# Patient Record
Sex: Female | Born: 1960 | Race: White | Hispanic: No | Marital: Married | Smoking: Never smoker
Health system: Southern US, Community
[De-identification: ages and names within clinical notes are randomized; demographics above are authoritative.]

## PROBLEM LIST (undated history)

## (undated) DIAGNOSIS — N6099 Unspecified benign mammary dysplasia of unspecified breast: Secondary | ICD-10-CM

## (undated) DIAGNOSIS — Z808 Family history of malignant neoplasm of other organs or systems: Secondary | ICD-10-CM

## (undated) DIAGNOSIS — R87619 Unspecified abnormal cytological findings in specimens from cervix uteri: Secondary | ICD-10-CM

## (undated) DIAGNOSIS — K219 Gastro-esophageal reflux disease without esophagitis: Secondary | ICD-10-CM

## (undated) DIAGNOSIS — D649 Anemia, unspecified: Secondary | ICD-10-CM

## (undated) DIAGNOSIS — Z803 Family history of malignant neoplasm of breast: Secondary | ICD-10-CM

## (undated) DIAGNOSIS — N83209 Unspecified ovarian cyst, unspecified side: Secondary | ICD-10-CM

## (undated) DIAGNOSIS — M7062 Trochanteric bursitis, left hip: Secondary | ICD-10-CM

## (undated) HISTORY — PX: OVARIAN CYST SURGERY: SHX726

## (undated) HISTORY — DX: Family history of malignant neoplasm of other organs or systems: Z80.8

## (undated) HISTORY — PX: CERVICAL BIOPSY  W/ LOOP ELECTRODE EXCISION: SUR135

## (undated) HISTORY — DX: Unspecified abnormal cytological findings in specimens from cervix uteri: R87.619

## (undated) HISTORY — PX: TONSILLECTOMY: SUR1361

## (undated) HISTORY — DX: Family history of malignant neoplasm of breast: Z80.3

## (undated) HISTORY — DX: Trochanteric bursitis, left hip: M70.62

## (undated) HISTORY — DX: Unspecified ovarian cyst, unspecified side: N83.209

---

## 1991-12-26 DIAGNOSIS — N83209 Unspecified ovarian cyst, unspecified side: Secondary | ICD-10-CM

## 1991-12-26 HISTORY — DX: Unspecified ovarian cyst, unspecified side: N83.209

## 1993-12-25 DIAGNOSIS — R87619 Unspecified abnormal cytological findings in specimens from cervix uteri: Secondary | ICD-10-CM

## 1993-12-25 HISTORY — DX: Unspecified abnormal cytological findings in specimens from cervix uteri: R87.619

## 2007-12-06 ENCOUNTER — Ambulatory Visit: Payer: Self-pay | Admitting: Internal Medicine

## 2009-03-23 ENCOUNTER — Ambulatory Visit: Payer: Self-pay | Admitting: Internal Medicine

## 2010-03-16 ENCOUNTER — Ambulatory Visit: Payer: Self-pay | Admitting: Internal Medicine

## 2011-03-07 LAB — HM PAP SMEAR: HM Pap smear: NORMAL

## 2011-03-29 ENCOUNTER — Ambulatory Visit: Payer: Self-pay | Admitting: Internal Medicine

## 2011-04-10 ENCOUNTER — Ambulatory Visit: Payer: Self-pay | Admitting: Internal Medicine

## 2012-02-23 ENCOUNTER — Encounter: Payer: Self-pay | Admitting: Internal Medicine

## 2012-02-23 ENCOUNTER — Ambulatory Visit (INDEPENDENT_AMBULATORY_CARE_PROVIDER_SITE_OTHER): Payer: No Typology Code available for payment source | Admitting: Internal Medicine

## 2012-02-23 ENCOUNTER — Other Ambulatory Visit (HOSPITAL_COMMUNITY)
Admission: RE | Admit: 2012-02-23 | Discharge: 2012-02-23 | Disposition: A | Discharge status: Home / Self Care | Payer: No Typology Code available for payment source | Source: Ambulatory Visit | Attending: Internal Medicine | Admitting: Internal Medicine

## 2012-02-23 VITALS — BP 122/80 | HR 93 | Temp 98.6°F | Resp 14 | Ht 67.5 in | Wt 168.5 lb

## 2012-02-23 DIAGNOSIS — R5383 Other fatigue: Secondary | ICD-10-CM

## 2012-02-23 DIAGNOSIS — Z1159 Encounter for screening for other viral diseases: Secondary | ICD-10-CM | POA: Insufficient documentation

## 2012-02-23 DIAGNOSIS — R5381 Other malaise: Secondary | ICD-10-CM

## 2012-02-23 DIAGNOSIS — Z01419 Encounter for gynecological examination (general) (routine) without abnormal findings: Secondary | ICD-10-CM | POA: Insufficient documentation

## 2012-02-23 DIAGNOSIS — N760 Acute vaginitis: Secondary | ICD-10-CM | POA: Insufficient documentation

## 2012-02-23 DIAGNOSIS — Z832 Family history of diseases of the blood and blood-forming organs and certain disorders involving the immune mechanism: Secondary | ICD-10-CM

## 2012-02-23 DIAGNOSIS — Z8249 Family history of ischemic heart disease and other diseases of the circulatory system: Secondary | ICD-10-CM | POA: Insufficient documentation

## 2012-02-23 DIAGNOSIS — Z1239 Encounter for other screening for malignant neoplasm of breast: Secondary | ICD-10-CM

## 2012-02-23 DIAGNOSIS — Z124 Encounter for screening for malignant neoplasm of cervix: Secondary | ICD-10-CM

## 2012-02-23 DIAGNOSIS — Z1211 Encounter for screening for malignant neoplasm of colon: Secondary | ICD-10-CM

## 2012-02-23 NOTE — Assessment & Plan Note (Signed)
Checking TSH,  CMET and CBC .  Encouraged regular exercise, low glycemic index diet.

## 2012-02-23 NOTE — Progress Notes (Signed)
Subjective:    Patient ID: Maria [name], female    DOB: 1961-06-27, 51 y.o.   MRN: [id number]  HPI  Maria [name] is a healthy 51 yr old female who is here for her annual exam.  She continues to have menses  every 28 day,  Mostly clots,  lirghter than previously.  Her mammogram is done  PA every March at [facility].  Her last PAP was in 2011, at which time she was referred to [facility]  for cervical polyp which was removed. No new issues.      Review of Systems  Constitutional: Negative for fever, chills, appetite change, fatigue and unexpected weight change.  HENT: Negative for hearing loss, ear pain, nosebleeds, congestion, sore throat, facial swelling, rhinorrhea, sneezing, mouth sores, trouble swallowing, neck pain, neck stiffness, voice change, postnasal drip, sinus pressure, tinnitus and ear discharge.   Eyes: Negative for pain, discharge, redness and visual disturbance.  Respiratory: Negative for cough, chest tightness, shortness of breath, wheezing and stridor.   Cardiovascular: Negative for chest pain, palpitations and leg swelling.  Gastrointestinal: Negative for nausea, vomiting, abdominal pain, diarrhea, constipation, blood in stool, abdominal distention and anal bleeding.  Genitourinary: Negative for dysuria and flank pain.  Musculoskeletal: Negative for myalgias, arthralgias and gait problem.  Skin: Negative for color change and rash.  Neurological: Negative for dizziness, weakness, light-headedness and headaches.  Hematological: Negative for adenopathy. Does not bruise/bleed easily.  Psychiatric/Behavioral: Negative for suicidal ideas, sleep disturbance and dysphoric mood. The patient is not nervous/anxious.        Objective:   Physical Exam  Constitutional: She is oriented to person, place, and time. She appears well-developed and well-nourished.  HENT:  Mouth/Throat: Oropharynx is clear and moist.  Eyes: EOM are normal. Pupils are equal, round, and reactive to light.  No scleral icterus.  Neck: Normal range of motion. Neck supple. No JVD present. No thyromegaly present.  Cardiovascular: Normal rate, regular rhythm, normal heart sounds and intact distal pulses.   Pulmonary/Chest: Effort normal and breath sounds normal.  Abdominal: Soft. Bowel sounds are normal. She exhibits no mass. There is no tenderness.  Genitourinary: Vagina normal and uterus normal.  Musculoskeletal: Normal range of motion. She exhibits no edema.  Lymphadenopathy:    She has no cervical adenopathy.  Neurological: She is alert and oriented to person, place, and time.  Skin: Skin is warm and dry.  Psychiatric: She has a normal mood and affect.      Assessment & Plan:   Subjective:     Maria [name] is a 51 y.o. female and is here for a comprehensive physical exam. The patient reports no problems.  History   Social History  . Marital Status: Married    Spouse Name: N/A    Number of Children: N/A  . Years of Education: N/A   Occupational History  . Not on file.   Social History Main Topics  . Smoking status: Never Smoker   . Smokeless tobacco: Never Used  . Alcohol Use: Yes  . Drug Use: No  . Sexually Active: Not on file   Other Topics Concern  . Not on file   Social History Narrative  . No narrative on file   No health maintenance topics applied.  The following portions of the patient's history were reviewed and updated as appropriate: allergies, current medications, past family history, past medical history, past social history, past surgical history and problem list.  Review of Systems A comprehensive review of systems was negative except  for: Endocrine: positive for menses becoming lighter,    Objective:    BP 122/80  Pulse 93  Temp(Src) 98.6 F (37 C) (Oral)  Resp 14  Ht 5' 7.5" (1.715 m)  Wt 168 lb 8 oz (76.431 kg)  BMI 26.00 kg/m2  SpO2 95%  LMP 02/11/2012  General Appearance:    Alert, cooperative, no distress, appears stated age  Head:     Normocephalic, without obvious abnormality, atraumatic  Eyes:    PERRL, conjunctiva/corneas clear, EOM's intact, fundi    benign, both eyes  Ears:    Normal TM's and external ear canals, both ears  Nose:   Nares normal, septum midline, mucosa normal, no drainage    or sinus tenderness  Throat:   Lips, mucosa, and tongue normal; teeth and gums normal  Neck:   Supple, symmetrical, trachea midline, no adenopathy;    thyroid:  no enlargement/tenderness/nodules; no carotid   bruit or JVD  Back:     Symmetric, no curvature, ROM normal, no CVA tenderness  Lungs:     Clear to auscultation bilaterally, respirations unlabored  Chest Wall:    No tenderness or deformity   Heart:    Regular rate and rhythm, S1 and S2 normal, no murmur, rub   or gallop  Breast Exam:    No tenderness, masses, or nipple abnormality  Abdomen:     Soft, non-tender, bowel sounds active all four quadrants,    no masses, no organomegaly  Genitalia:    Normal female without lesion, discharge or tenderness  Rectal:    Normal tone, normal prostate, no masses or tenderness;   guaiac negative stool  Extremities:   Extremities normal, atraumatic, no cyanosis or edema  Pulses:   2+ and symmetric all extremities  Skin:   Skin color, texture, turgor normal, no rashes or lesions  Lymph nodes:   Cervical, supraclavicular, and axillary nodes normal  Neurologic:   CNII-XII intact, normal strength, sensation and reflexes    throughout      Assessment:    Healthy female exam.   Plan:     See After Visit Summary for Counseling Recommendations

## 2012-02-23 NOTE — Assessment & Plan Note (Signed)
Father and several of his siblings have history of LE DVTs.  Will check Factor V Leiden.  Discussed risks of HRT with patient.

## 2012-02-25 ENCOUNTER — Encounter: Payer: Self-pay | Admitting: Internal Medicine

## 2012-02-25 DIAGNOSIS — Z124 Encounter for screening for malignant neoplasm of cervix: Secondary | ICD-10-CM | POA: Insufficient documentation

## 2012-02-25 NOTE — Assessment & Plan Note (Signed)
PAP smear was done today . Pelvic exam was normal.

## 2012-03-04 NOTE — Progress Notes (Signed)
Quick Note:  Your PAP smear was normal. ______

## 2012-03-23 LAB — HM PAP SMEAR: HM Pap smear: NORMAL

## 2012-04-02 ENCOUNTER — Ambulatory Visit: Payer: Self-pay | Admitting: Internal Medicine

## 2012-04-05 ENCOUNTER — Telehealth: Payer: Self-pay | Admitting: Internal Medicine

## 2012-04-05 NOTE — Telephone Encounter (Signed)
Notified patient mammogram is normal.

## 2012-04-11 ENCOUNTER — Telehealth: Payer: Self-pay | Admitting: Internal Medicine

## 2012-04-11 NOTE — Telephone Encounter (Signed)
Her mammogram was normal.  Repeat in one year 

## 2012-04-11 NOTE — Telephone Encounter (Signed)
Patient notified

## 2012-04-17 ENCOUNTER — Telehealth: Payer: Self-pay | Admitting: Internal Medicine

## 2012-04-17 NOTE — Telephone Encounter (Signed)
Patient notified of results.

## 2012-04-17 NOTE — Telephone Encounter (Signed)
Labs reviewed and are normal,  No muttation seen for Factor V Leiden (no predisposition for blood clots)

## 2012-04-18 ENCOUNTER — Encounter: Payer: Self-pay | Admitting: Internal Medicine

## 2012-04-29 ENCOUNTER — Ambulatory Visit: Payer: Self-pay | Admitting: Gastroenterology

## 2012-04-29 LAB — HM COLONOSCOPY

## 2012-05-06 ENCOUNTER — Encounter: Payer: Self-pay | Admitting: Internal Medicine

## 2012-05-06 ENCOUNTER — Ambulatory Visit (INDEPENDENT_AMBULATORY_CARE_PROVIDER_SITE_OTHER): Payer: No Typology Code available for payment source | Admitting: Internal Medicine

## 2012-05-06 VITALS — BP 112/64 | HR 87 | Temp 98.9°F | Resp 14 | Wt 169.5 lb

## 2012-05-06 DIAGNOSIS — G521 Disorders of glossopharyngeal nerve: Secondary | ICD-10-CM

## 2012-05-06 DIAGNOSIS — Z8742 Personal history of other diseases of the female genital tract: Secondary | ICD-10-CM | POA: Insufficient documentation

## 2012-05-06 DIAGNOSIS — R87619 Unspecified abnormal cytological findings in specimens from cervix uteri: Secondary | ICD-10-CM

## 2012-05-06 DIAGNOSIS — N83209 Unspecified ovarian cyst, unspecified side: Secondary | ICD-10-CM | POA: Insufficient documentation

## 2012-05-06 MED ORDER — GABAPENTIN 100 MG PO CAPS: 100.0000 mg | ORAL_CAPSULE | Freq: Three times a day (TID) | ORAL | Status: DC

## 2012-05-06 MED ORDER — PREDNISONE (PAK) 10 MG PO TABS: ORAL_TABLET | ORAL | Status: AC

## 2012-05-06 NOTE — Progress Notes (Signed)
Patient ID: Maria [name], female   DOB: 1961/07/31, 51 y.o.   MRN: [id number]   Patient Active Problem List  Diagnoses  . Family history of blood clots  . Fatigue  . Screening for cervical cancer  . Glossopharyngeal neuralgia  . Abnormal cervical Papanicolaou smear  . Ovarian cyst    Subjective:  CC:   Chief Complaint  Patient presents with  . Pain    head    HPI:   Maria [name] a 51 y.o. female who presents with right sided facial pain which started in the parietal area 10 days ago.  There was no prior UR, head trauma, oral surgery, or  Zoster  Episode.  Scalp has at times been too sensitive to touch, and the pain comes and goes like an electric shock and radiates to her tongue and ear.  It is triggered by talking and eating, virtually any activity that requires moving her tongue causes the pain to shoot to the back of the throat and the inside of the ear. Pain also has a stabbing quality like an ice pick. The pain was most severe after 24 hours.  There has been slight improvement  Over last seven days but the pain is still present.  There is a FH of multiple sclerosis.    Past Medical History  Diagnosis Date  . Abnormal cervical Papanicolaou smear 1995    s/p LEEP   . Ovarian cyst 1993    s/p laparascopic surgery    History reviewed. No pertinent past surgical history.       The following portions of the patient's history were reviewed and updated as appropriate: Allergies, current medications, and problem list.    Review of Systems:   12 Pt  review of systems was negative except those addressed in the HPI,     History   Social History  . Marital Status: Married    Spouse Name: N/A    Number of Children: N/A  . Years of Education: N/A   Occupational History  . Not on file.   Social History Main Topics  . Smoking status: Never Smoker   . Smokeless tobacco: Never Used  . Alcohol Use: 2.4 oz/week    4 Glasses of wine per week  . Drug Use: No    . Sexually Active: Not on file   Other Topics Concern  . Not on file   Social History Narrative  . No narrative on file    Objective:  BP 112/64  Pulse 87  Temp(Src) 98.9 F (37.2 C) (Oral)  Resp 14  Wt 169 lb 8 oz (76.885 kg)  SpO2 98%  LMP 05/02/2012  General appearance: alert, cooperative and appears stated age Ears: normal TM's and external ear canals both ears Throat: lips, mucosa, and tongue normal; teeth and gums normal Neck: no adenopathy, no carotid bruit, supple, symmetrical, trachea midline and thyroid not enlarged, symmetric, no tenderness/mass/nodules Back: symmetric, no curvature. ROM normal. No CVA tenderness. Lungs: clear to auscultation bilaterally Heart: regular rate and rhythm, S1, S2 normal, no murmur, click, rub or gallop Abdomen: soft, non-tender; bowel sounds normal; no masses,  no organomegaly Pulses: 2+ and symmetric Skin: Skin color, texture, turgor normal. No rashes or lesions Lymph nodes: Cervical, supraclavicular, and axillary nodes normal.  Assessment and Plan:  Abnormal cervical Papanicolaou smear S/p LEEP procedure remote.dy,  With her last 3 PAPS  done every other year by me, normal, last one 2012.   Glossopharyngeal neuralgia Suggested by normal cranial nerve  exam and history of lancinating pain involving base of tongue and radiating to ear. We discussed trial of carbamezpine but in prior patients it was too sedating for daytime use,  Will initiate prednisone taper and trial of gabapentin.  She will need an MRI/MRA to rule out demyelinating lesions given FH of MS, CPA tumor and carotid aneursym.    Updated Medication List Outpatient Encounter Prescriptions as of 05/06/2012  Medication Sig Dispense Refill  . cholecalciferol (VITAMIN D) 1000 UNITS tablet Take 1,000 Units by mouth daily.      Marland Kitchen gabapentin (NEURONTIN) 100 MG capsule Take 1 capsule (100 mg total) by mouth 3 (three) times daily.  30 capsule  3  . predniSONE (STERAPRED  UNI-PAK) 10 MG tablet 6 tablets on Day 1 , then reduce by 1 tablet daily until gone  21 tablet  0     Orders Placed This Encounter  Procedures  . HM MAMMOGRAPHY  . HM PAP SMEAR  . HM COLONOSCOPY    No Follow-up on file.

## 2012-05-06 NOTE — Assessment & Plan Note (Addendum)
Suggested by normal cranial nerve exam and history of lancinating pain involving base of tongue and radiating to ear. We discussed trial of carbamezpine but in prior patients it was too sedating for daytime use,  Will initiate prednisone taper and trial of gabapentin.  She will need an MRI/MRA to rule out demyelinating lesions given FH of MS, CPA tumor and carotid aneursym.

## 2012-05-06 NOTE — Assessment & Plan Note (Signed)
S/p LEEP procedure remote.dy,  With her last 3 PAPS  done every other year by me, normal, last one 2012.

## 2012-05-08 ENCOUNTER — Telehealth: Payer: Self-pay | Admitting: Internal Medicine

## 2012-05-08 DIAGNOSIS — G521 Disorders of glossopharyngeal nerve: Secondary | ICD-10-CM

## 2012-05-08 NOTE — Telephone Encounter (Signed)
We discussed both options.  After doing some review of the literature it is strongly recommended that an MRI/MRA be done, so I am recommending it prior to Neurology evaluation prior to her neurology evaluation.  But if she wants to wait, ok.  It is an MRI and an MRA

## 2012-05-08 NOTE — Telephone Encounter (Signed)
I made the MRA appointment for this Friday at [facility] when i called patient to inform her of the appointment she states that she was going to see a neurologist first.  Please advise if patient is suppose to have a MRA or if a referral to the neurologist is the route you both discussed on Monday.  If so please put referral in for that.  I need to know if I need to cancel the MRA.

## 2012-05-10 ENCOUNTER — Inpatient Hospital Stay (HOSPITAL_COMMUNITY): Admission: RE | Admit: 2012-05-10 | Payer: No Typology Code available for payment source | Source: Ambulatory Visit

## 2012-05-13 ENCOUNTER — Encounter: Payer: Self-pay | Admitting: Internal Medicine

## 2012-06-03 ENCOUNTER — Encounter: Payer: Self-pay | Admitting: Internal Medicine

## 2012-08-30 LAB — BASIC METABOLIC PANEL
BUN: 10 mg/dL (ref 4–21)
CREATININE: 0.8 mg/dL (ref 0.5–1.1)
GLUCOSE: 83 mg/dL
POTASSIUM: 4.1 mmol/L (ref 3.4–5.3)
SODIUM: 136 mmol/L — AB (ref 137–147)

## 2012-08-30 LAB — CBC AND DIFFERENTIAL
HEMOGLOBIN: 12.2 g/dL (ref 12.0–16.0)
Platelets: 345 10*3/uL (ref 150–399)
WBC: 3.9 10^3/mL

## 2012-08-30 LAB — HEPATIC FUNCTION PANEL
ALT: 11 U/L (ref 7–35)
AST: 13 U/L (ref 13–35)
BILIRUBIN, TOTAL: 0.3 mg/dL

## 2012-08-30 LAB — LIPID PANEL
Cholesterol: 177 mg/dL (ref 0–200)
HDL: 63 mg/dL (ref 35–70)
LDL CALC: 103 mg/dL
TRIGLYCERIDES: 57 mg/dL (ref 40–160)

## 2012-08-30 LAB — TSH: TSH: 1.7 u[IU]/mL (ref 0.41–5.90)

## 2013-07-08 ENCOUNTER — Telehealth: Payer: Self-pay | Admitting: Internal Medicine

## 2013-07-08 DIAGNOSIS — Z1239 Encounter for other screening for malignant neoplasm of breast: Secondary | ICD-10-CM

## 2013-07-08 NOTE — Telephone Encounter (Signed)
Spoke with patient, she needs a referral for her mammogram. Need it to be scheduled on 8/26, that particular day she can get it at a reduced rate. Please order, she tried to schedule it herself but they would not let her do it.

## 2013-07-08 NOTE — Telephone Encounter (Signed)
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

## 2013-07-08 NOTE — Telephone Encounter (Signed)
Mammogram order. Printed. Please make sure it gets scheduled for 8/26.

## 2013-07-09 NOTE — Telephone Encounter (Signed)
Left message for patient to call our office in regards to her mammo at [facility] on 8/26 @ 140.

## 2013-07-11 NOTE — Telephone Encounter (Signed)
Left detailed information on patient voicemail.  

## 2013-07-23 ENCOUNTER — Encounter: Payer: Self-pay | Admitting: Internal Medicine

## 2013-07-23 ENCOUNTER — Ambulatory Visit (INDEPENDENT_AMBULATORY_CARE_PROVIDER_SITE_OTHER): Payer: No Typology Code available for payment source | Admitting: Internal Medicine

## 2013-07-23 VITALS — BP 116/78 | HR 71 | Temp 98.4°F | Resp 14 | Ht 68.0 in | Wt 173.0 lb

## 2013-07-23 DIAGNOSIS — Z0000 Encounter for general adult medical examination without abnormal findings: Secondary | ICD-10-CM

## 2013-07-23 DIAGNOSIS — G8929 Other chronic pain: Secondary | ICD-10-CM

## 2013-07-23 DIAGNOSIS — M25559 Pain in unspecified hip: Secondary | ICD-10-CM

## 2013-07-23 DIAGNOSIS — R87619 Unspecified abnormal cytological findings in specimens from cervix uteri: Secondary | ICD-10-CM

## 2013-07-23 DIAGNOSIS — Z13220 Encounter for screening for lipoid disorders: Secondary | ICD-10-CM

## 2013-07-23 DIAGNOSIS — G521 Disorders of glossopharyngeal nerve: Secondary | ICD-10-CM

## 2013-07-23 MED ORDER — GABAPENTIN 100 MG PO CAPS: 300.0000 mg | ORAL_CAPSULE | Freq: Three times a day (TID) | ORAL | Status: DC

## 2013-07-23 NOTE — Patient Instructions (Addendum)
You had your annualwellness exam today  Your cholesterol is fine.  I have increased the gabapentin strength to 300 mg .  Your hip pain sounds like plain old osteoarthritis.  Try taking 1000 mg tylenol and/or celebrex at bedtime

## 2013-07-23 NOTE — Progress Notes (Signed)
Patient ID: Maria [name], female   DOB: [birth year], 52 y.o.   MRN: [id number]   Subjective:     Maria [name] is a 52 y.o. female and is here for a comprehensive physical exam. The patient reports no problems. ANNUAL NONGYN EXAM..   BILATERAL hip pain,  Right greater than left aggravated by rest and sleeping.Headache severe required rest and neuronti. N. 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     History   Social History  . Marital Status: Married    Spouse Name: N/A    Number of Children: N/A  . Years of Education: N/A   Occupational History  . Not on file.   Social History Main Topics  . Smoking status: Never Smoker   . Smokeless tobacco: Never Used  . Alcohol Use: 2.4 oz/week    4 Glasses of wine per week  . Drug Use: No  . Sexually Active: Not on file   Other Topics Concern  . Not on file   Social History Narrative  . No narrative on file   Health Maintenance  Topic Date Due  . Influenza Vaccine  08/25/2013  . Mammogram  04/06/2014  . Pap Smear  07/24/2015  . Tetanus/tdap  07/23/2021  . Colonoscopy  04/29/2022    The following portions of the patient's history were reviewed and updated as appropriate: allergies, current medications, past family history, past medical history, past social history, past surgical history and problem list.  Review of Systems A comprehensive review of systems was negative.   Objective:   BP 116/78  Pulse 71  Temp(Src) 98.4 F (36.9 C) (Oral)  Resp 14  Ht 5\' 8"  (1.727 m)  Wt 173 lb (78.472 kg)  BMI 26.31 kg/m2  SpO2 98%  LMP 06/22/2013  General Appearance:    Alert, cooperative, no distress, appears stated age  Head:    Normocephalic, without obvious abnormality, atraumatic  Eyes:    PERRL, conjunctiva/corneas clear, EOM's intact, fundi    benign, both eyes  Ears:    Normal TM's and external ear canals, both ears  Nose:   Nares normal, septum midline, mucosa normal, no  drainage    or sinus tenderness  Throat:   Lips, mucosa, and tongue normal; teeth and gums normal  Neck:   Supple, symmetrical, trachea midline, no adenopathy;    thyroid:  no enlargement/tenderness/nodules; no carotid   bruit or JVD  Back:     Symmetric, no curvature, ROM normal, no CVA tenderness  Lungs:     Clear to auscultation bilaterally, respirations unlabored  Chest Wall:    No tenderness or deformity   Heart:    Regular rate and rhythm, S1 and S2 normal, no murmur, rub   or gallop  Breast Exam:    No tenderness, masses, or nipple abnormality  Abdomen:     Soft, non-tender, bowel sounds active all four quadrants,    no masses, no organomegaly  Extremities:   Extremities normal, atraumatic, no cyanosis or edema  Pulses:   2+ and symmetric all extremities  Skin:   Skin color, texture, turgor normal, no rashes or lesions  Lymph nodes:   Cervical, supraclavicular, and axillary nodes normal  Neurologic:   CNII-XII intact, normal strength, sensation and reflexes    throughout    Assessment and plan:  Screening for hyperlipidemia 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   Routine general medical examination at a health care facility Annual comprehensive exam was done including breast, exam.  All screenings have been addressed .   Glossopharyngeal neuralgia Recent episode relieved with gabapentin 300 mg dose  Abnormal cervical Papanicolaou smear recent PAP was normal per patient.  Gyn follow up annually  Hip pain, chronic ROM is normal.  Pain lasts for < 20 minutes and aggravated by rest,  Not motion..  Treat prn OA    Updated Medication List Outpatient Encounter Prescriptions as of 07/23/2013  Medication Sig Dispense Refill  . cholecalciferol (VITAMIN D) 1000 UNITS tablet Take 1,000 Units by mouth daily.      Marland Kitchen gabapentin (NEURONTIN) 100 MG capsule Take 3 capsules (300 mg total) by mouth 3 (three) times daily.  90 capsule  3  .  [DISCONTINUED] gabapentin (NEURONTIN) 100 MG capsule Take 1 capsule (100 mg total) by mouth 3 (three) times daily.  30 capsule  3   No facility-administered encounter medications on file as of 07/23/2013.

## 2013-07-25 DIAGNOSIS — Z0000 Encounter for general adult medical examination without abnormal findings: Secondary | ICD-10-CM | POA: Insufficient documentation

## 2013-07-25 DIAGNOSIS — Z13220 Encounter for screening for lipoid disorders: Secondary | ICD-10-CM | POA: Insufficient documentation

## 2013-07-25 DIAGNOSIS — G8929 Other chronic pain: Secondary | ICD-10-CM | POA: Insufficient documentation

## 2013-07-25 DIAGNOSIS — Z0001 Encounter for general adult medical examination with abnormal findings: Secondary | ICD-10-CM | POA: Insufficient documentation

## 2013-07-25 NOTE — Assessment & Plan Note (Signed)
Recent episode relieved with gabapentin 300 mg dose

## 2013-07-25 NOTE — Assessment & Plan Note (Signed)
ROM is normal.  Pain lasts for < 20 minutes and aggravated by rest,  Not motion..  Treat prn OA

## 2013-07-25 NOTE — Assessment & Plan Note (Addendum)
Annual comprehensive exam was done including breast, exam. All screenings have been addressed .  

## 2013-07-25 NOTE — Assessment & Plan Note (Signed)
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

## 2013-07-25 NOTE — Assessment & Plan Note (Signed)
recent PAP was normal per patient.  Gyn follow up annually

## 2013-08-19 ENCOUNTER — Ambulatory Visit: Payer: Self-pay | Admitting: Internal Medicine

## 2013-09-04 ENCOUNTER — Encounter: Payer: Self-pay | Admitting: Internal Medicine

## 2014-04-15 NOTE — Telephone Encounter (Signed)
Patient seen neurology on 6.10.13

## 2014-06-29 ENCOUNTER — Ambulatory Visit (INDEPENDENT_AMBULATORY_CARE_PROVIDER_SITE_OTHER): Payer: No Typology Code available for payment source | Admitting: Podiatry

## 2014-06-29 ENCOUNTER — Ambulatory Visit (INDEPENDENT_AMBULATORY_CARE_PROVIDER_SITE_OTHER): Payer: No Typology Code available for payment source

## 2014-06-29 ENCOUNTER — Encounter: Payer: Self-pay | Admitting: Podiatry

## 2014-06-29 VITALS — BP 110/74 | HR 78 | Resp 16 | Ht 67.75 in | Wt 172.0 lb

## 2014-06-29 DIAGNOSIS — M722 Plantar fascial fibromatosis: Secondary | ICD-10-CM

## 2014-06-29 MED ORDER — MELOXICAM 15 MG PO TABS: 15.0000 mg | ORAL_TABLET | Freq: Every day | ORAL | Status: DC

## 2014-06-29 MED ORDER — METHYLPREDNISOLONE (PAK) 4 MG PO TABS: ORAL_TABLET | ORAL | Status: DC

## 2014-06-29 NOTE — Patient Instructions (Signed)

## 2014-06-29 NOTE — Progress Notes (Signed)
   Subjective:    Patient ID: Maria [name], female    DOB: Jan 31, 1961, 53 y.o.   MRN: [id number]  HPI Comments: "I have heel pain"  Patient c/o aching plantar heel right for about 1 year. She does have AM pain and after sitting for long periods. Walking a lot aggravates it. She has tried night splint that has helped.  Foot Pain Associated symptoms include arthralgias, diaphoresis and myalgias.      Review of Systems  Constitutional: Positive for diaphoresis.  Musculoskeletal: Positive for arthralgias, gait problem and myalgias.  All other systems reviewed and are negative.      Objective:   Physical Exam: I have reviewed her past medical history medications allergies surgeries social history and review of systems. Pulses are strongly palpable bilateral. Neurologic sensorium is intact per since once the monofilament. Deep tendon reflexes are intact bilateral muscle strength is 5 over 5 dorsiflexors plantar flexors inverters everters all intrinsic musculature is intact. Orthopedic evaluation demonstrates all joints distal to the ankle a full range of motion without crepitation. Cutaneous evaluation demonstrates supple well hydrated cutis. She has pain on palpation medial calcaneal tubercle of the right heel. Radiographic evaluation does demonstrate a soft tissue increase in density at the plantar fascial calcaneal insertion site of the right heel.        Assessment & Plan:  Assessment: Plantar fasciitis right.  Plan: Discussed etiology pathology conservative versus surgical therapies. Injected the right heel today with Kenalog and local anesthetic. Started her on Medrol Dosepak to be followed by noted. Plantar fascial brace and a night splint were dispensed. We discussed the etiology pathology conservative versus surgical therapies. We discussed appropriate shoe gear stretching exercises ice therapy and shoe gear modifications. I will followup with her in one month

## 2014-07-29 ENCOUNTER — Ambulatory Visit: Payer: No Typology Code available for payment source | Admitting: Podiatry

## 2014-08-05 ENCOUNTER — Telehealth: Payer: Self-pay | Admitting: Internal Medicine

## 2014-08-05 NOTE — Telephone Encounter (Signed)
LMTCB for mammogram appt details (8/27 at [facility] in [place] at 12pm)

## 2014-08-20 ENCOUNTER — Ambulatory Visit: Payer: Self-pay | Admitting: Internal Medicine

## 2014-08-27 ENCOUNTER — Encounter: Payer: Self-pay | Admitting: Internal Medicine

## 2014-08-27 ENCOUNTER — Ambulatory Visit (INDEPENDENT_AMBULATORY_CARE_PROVIDER_SITE_OTHER)
Admission: RE | Admit: 2014-08-27 | Discharge: 2014-08-27 | Disposition: A | Discharge status: Home / Self Care | Payer: No Typology Code available for payment source | Source: Ambulatory Visit | Attending: Internal Medicine | Admitting: Internal Medicine

## 2014-08-27 ENCOUNTER — Ambulatory Visit (INDEPENDENT_AMBULATORY_CARE_PROVIDER_SITE_OTHER): Payer: No Typology Code available for payment source | Admitting: Internal Medicine

## 2014-08-27 VITALS — BP 122/86 | HR 69 | Temp 98.4°F | Resp 16 | Ht 68.25 in | Wt 172.0 lb

## 2014-08-27 DIAGNOSIS — M25559 Pain in unspecified hip: Secondary | ICD-10-CM

## 2014-08-27 DIAGNOSIS — M25552 Pain in left hip: Secondary | ICD-10-CM

## 2014-08-27 DIAGNOSIS — Z0000 Encounter for general adult medical examination without abnormal findings: Secondary | ICD-10-CM

## 2014-08-27 DIAGNOSIS — M5430 Sciatica, unspecified side: Secondary | ICD-10-CM

## 2014-08-27 DIAGNOSIS — Z8249 Family history of ischemic heart disease and other diseases of the circulatory system: Secondary | ICD-10-CM

## 2014-08-27 DIAGNOSIS — Z832 Family history of diseases of the blood and blood-forming organs and certain disorders involving the immune mechanism: Secondary | ICD-10-CM

## 2014-08-27 DIAGNOSIS — M5432 Sciatica, left side: Secondary | ICD-10-CM

## 2014-08-27 DIAGNOSIS — Z8742 Personal history of other diseases of the female genital tract: Secondary | ICD-10-CM

## 2014-08-27 DIAGNOSIS — Z124 Encounter for screening for malignant neoplasm of cervix: Secondary | ICD-10-CM

## 2014-08-27 DIAGNOSIS — Z13220 Encounter for screening for lipoid disorders: Secondary | ICD-10-CM

## 2014-08-27 DIAGNOSIS — G8929 Other chronic pain: Secondary | ICD-10-CM

## 2014-08-27 DIAGNOSIS — M25551 Pain in right hip: Secondary | ICD-10-CM

## 2014-08-27 MED ORDER — GABAPENTIN 100 MG PO CAPS: 100.0000 mg | ORAL_CAPSULE | Freq: Three times a day (TID) | ORAL | Status: DC

## 2014-08-27 NOTE — Patient Instructions (Addendum)
Trial of gabapentin to take in the evening for your hip pain.,  Can be combined with melxicam and tylenol  Start with 100 mg daily,  increase to 300 mg  (100 mg  Per week)   Plain films of hip and spine to be done at [facility] between 8 and 4 pm   Health Maintenance Adopting a healthy lifestyle and getting preventive care can go a long way to promote health and wellness. Talk with your health care provider about what schedule of regular examinations is right for you. This is a good chance for you to check in with your provider about disease prevention and staying healthy. In between checkups, there are plenty of things you can do on your own. Experts have done a lot of research about which lifestyle changes and preventive measures are most likely to keep you healthy. Ask your health care provider for more information. WEIGHT AND DIET  Eat a healthy diet  Be sure to include plenty of vegetables, fruits, low-fat dairy products, and lean protein.  Do not eat a lot of foods high in solid fats, added sugars, or salt.  Get regular exercise. This is one of the most important things you can do for your health.  Most adults should exercise for at least 150 minutes each week. The exercise should increase your heart rate and make you sweat (moderate-intensity exercise).  Most adults should also do strengthening exercises at least twice a week. This is in addition to the moderate-intensity exercise.  Maintain a healthy weight  Body mass index (BMI) is a measurement that can be used to identify possible weight problems. It estimates body fat based on height and weight. Your health care provider can help determine your BMI and help you achieve or maintain a healthy weight.  For females 3 years of age and older:   A BMI below 18.5 is considered underweight.  A BMI of 18.5 to 24.9 is normal.  A BMI of 25 to 29.9 is considered overweight.  A BMI of 30 and above is considered obese.  Watch  levels of cholesterol and blood lipids  You should start having your blood tested for lipids and cholesterol at 54 years of age, then have this test every 5 years.  You may need to have your cholesterol levels checked more often if:  Your lipid or cholesterol levels are high.  You are older than 54 years of age.  You are at high risk for heart disease.  CANCER SCREENING   Lung Cancer  Lung cancer screening is recommended for adults 58-54 years old who are at high risk for lung cancer because of a history of smoking.  A yearly low-dose CT scan of the lungs is recommended for people who:  Currently smoke.  Have quit within the past 15 years.  Have at least a 30-pack-year history of smoking. A pack year is smoking an average of one pack of cigarettes a day for 1 year.  Yearly screening should continue until it has been 15 years since you quit.  Yearly screening should stop if you develop a health problem that would prevent you from having lung cancer treatment.  Breast Cancer  Practice breast self-awareness. This means understanding how your breasts normally appear and feel.  It also means doing regular breast self-exams. Let your health care provider know about any changes, no matter how small.  If you are in your 20s or 30s, you should have a clinical breast exam (CBE) by  a health care provider every 1-3 years as part of a regular health exam.  If you are 25 or older, have a CBE every year. Also consider having a breast X-ray (mammogram) every year.  If you have a family history of breast cancer, talk to your health care provider about genetic screening.  If you are at high risk for breast cancer, talk to your health care provider about having an MRI and a mammogram every year.  Breast cancer gene (BRCA) assessment is recommended for women who have family members with BRCA-related cancers. BRCA-related cancers include:  Breast.  Ovarian.  Tubal.  Peritoneal  cancers.  Results of the assessment will determine the need for genetic counseling and BRCA1 and BRCA2 testing. Cervical Cancer Routine pelvic examinations to screen for cervical cancer are no longer recommended for nonpregnant women who are considered low risk for cancer of the pelvic organs (ovaries, uterus, and vagina) and who do not have symptoms. A pelvic examination may be necessary if you have symptoms including those associated with pelvic infections. Ask your health care provider if a screening pelvic exam is right for you.   The Pap test is the screening test for cervical cancer for women who are considered at risk.  If you had a hysterectomy for a problem that was not cancer or a condition that could lead to cancer, then you no longer need Pap tests.  If you are older than 65 years, and you have had normal Pap tests for the past 10 years, you no longer need to have Pap tests.  If you have had past treatment for cervical cancer or a condition that could lead to cancer, you need Pap tests and screening for cancer for at least 20 years after your treatment.  If you no longer get a Pap test, assess your risk factors if they change (such as having a new sexual partner). This can affect whether you should start being screened again.  Some women have medical problems that increase their chance of getting cervical cancer. If this is the case for you, your health care provider may recommend more frequent screening and Pap tests.  The human papillomavirus (HPV) test is another test that may be used for cervical cancer screening. The HPV test looks for the virus that can cause cell changes in the cervix. The cells collected during the Pap test can be tested for HPV.  The HPV test can be used to screen women 49 years of age and older. Getting tested for HPV can extend the interval between normal Pap tests from three to five years.  An HPV test also should be used to screen women of any age who  have unclear Pap test results.  After 54 years of age, women should have HPV testing as often as Pap tests.  Colorectal Cancer  This type of cancer can be detected and often prevented.  Routine colorectal cancer screening usually begins at 54 years of age and continues through 54 years of age.  Your health care provider may recommend screening at an earlier age if you have risk factors for colon cancer.  Your health care provider may also recommend using home test kits to check for hidden blood in the stool.  A small camera at the end of a tube can be used to examine your colon directly (sigmoidoscopy or colonoscopy). This is done to check for the earliest forms of colorectal cancer.  Routine screening usually begins at age 86.  Direct examination  of the colon should be repeated every 5-10 years through 54 years of age. However, you may need to be screened more often if early forms of precancerous polyps or small growths are found. Skin Cancer  Check your skin from head to toe regularly.  Tell your health care provider about any new moles or changes in moles, especially if there is a change in a mole's shape or color.  Also tell your health care provider if you have a mole that is larger than the size of a pencil eraser.  Always use sunscreen. Apply sunscreen liberally and repeatedly throughout the day.  Protect yourself by wearing long sleeves, pants, a wide-brimmed hat, and sunglasses whenever you are outside. HEART DISEASE, DIABETES, AND HIGH BLOOD PRESSURE   Have your blood pressure checked at least every 1-2 years. High blood pressure causes heart disease and increases the risk of stroke.  If you are between 60 years and 58 years old, ask your health care provider if you should take aspirin to prevent strokes.  Have regular diabetes screenings. This involves taking a blood sample to check your fasting blood sugar level.  If you are at a normal weight and have a low risk for  diabetes, have this test once every three years after 54 years of age.  If you are overweight and have a high risk for diabetes, consider being tested at a younger age or more often. PREVENTING INFECTION  Hepatitis B  If you have a higher risk for hepatitis B, you should be screened for this virus. You are considered at high risk for hepatitis B if:  You were born in a country where hepatitis B is common. Ask your health care provider which countries are considered high risk.  Your parents were born in a high-risk country, and you have not been immunized against hepatitis B (hepatitis B vaccine).  You have HIV or AIDS.  You use needles to inject street drugs.  You live with someone who has hepatitis B.  You have had sex with someone who has hepatitis B.  You get hemodialysis treatment.  You take certain medicines for conditions, including cancer, organ transplantation, and autoimmune conditions. Hepatitis C  Blood testing is recommended for:  Everyone born from [birth year] through 1966.  Anyone with known risk factors for hepatitis C. Sexually transmitted infections (STIs)  You should be screened for sexually transmitted infections (STIs) including gonorrhea and chlamydia if:  You are sexually active and are younger than 54 years of age.  You are older than 54 years of age and your health care provider tells you that you are at risk for this type of infection.  Your sexual activity has changed since you were last screened and you are at an increased risk for chlamydia or gonorrhea. Ask your health care provider if you are at risk.  If you do not have HIV, but are at risk, it may be recommended that you take a prescription medicine daily to prevent HIV infection. This is called pre-exposure prophylaxis (PrEP). You are considered at risk if:  You are sexually active and do not regularly use condoms or know the HIV status of your partner(s).  You take drugs by injection.  You are  sexually active with a partner who has HIV. Talk with your health care provider about whether you are at high risk of being infected with HIV. If you choose to begin PrEP, you should first be tested for HIV. You should then be tested every  3 months for as long as you are taking PrEP.  PREGNANCY   If you are premenopausal and you may become pregnant, ask your health care provider about preconception counseling.  If you may become pregnant, take 400 to 800 micrograms (mcg) of folic acid every day.  If you want to prevent pregnancy, talk to your health care provider about birth control (contraception). OSTEOPOROSIS AND MENOPAUSE   Osteoporosis is a disease in which the bones lose minerals and strength with aging. This can result in serious bone fractures. Your risk for osteoporosis can be identified using a bone density scan.  If you are 74 years of age or older, or if you are at risk for osteoporosis and fractures, ask your health care provider if you should be screened.  Ask your health care provider whether you should take a calcium or vitamin D supplement to lower your risk for osteoporosis.  Menopause may have certain physical symptoms and risks.  Hormone replacement therapy may reduce some of these symptoms and risks. Talk to your health care provider about whether hormone replacement therapy is right for you.  HOME CARE INSTRUCTIONS   Schedule regular health, dental, and eye exams.  Stay current with your immunizations.   Do not use any tobacco products including cigarettes, chewing tobacco, or electronic cigarettes.  If you are pregnant, do not drink alcohol.  If you are breastfeeding, limit how much and how often you drink alcohol.  Limit alcohol intake to no more than 1 drink per day for nonpregnant women. One drink equals 12 ounces of beer, 5 ounces of wine, or 1 ounces of hard liquor.  Do not use street drugs.  Do not share needles.  Ask your health care provider for  help if you need support or information about quitting drugs.  Tell your health care provider if you often feel depressed.  Tell your health care provider if you have ever been abused or do not feel safe at home. Document Released: 06/26/2011 Document Revised: 04/27/2014 Document Reviewed: 11/12/2013 [facility] Patient Information 2015 [place], [place]. This information is not intended to replace advice given to you by your health care provider. Make sure you discuss any questions you have with your health care provider.

## 2014-08-27 NOTE — Progress Notes (Signed)
Patient ID: Maria [name], female   DOB: 1961-08-07, 53 y.o.   MRN: [id number]   Subjective:     Maria [name] is a 53 y.o. female and is here for a comprehensive physical exam. The patient reports problems - with right hip pain .  History   Social History  . Marital Status: Married    Spouse Name: N/A    Number of Children: N/A  . Years of Education: N/A   Occupational History  . Not on file.   Social History Main Topics  . Smoking status: Never Smoker   . Smokeless tobacco: Never Used  . Alcohol Use: 2.4 oz/week    4 Cans of beer per week  . Drug Use: No  . Sexual Activity: Yes   Other Topics Concern  . Not on file   Social History Narrative  . No narrative on file   Health Maintenance  Topic Date Due  . Influenza Vaccine  07/25/2014  . Pap Smear  07/24/2015  . Mammogram  08/20/2015  . Tetanus/tdap  07/23/2021  . Colonoscopy  04/29/2022    The following portions of the patient's history were reviewed and updated as appropriate: allergies, current medications, past family history, past medical history, past social history, past surgical history and problem list.  Review of Systems A comprehensive review of systems was negative.   Objective:   BP 122/86  Pulse 69  Temp(Src) 98.4 F (36.9 C) (Oral)  Resp 16  Ht 5' 8.25" (1.734 m)  Wt 172 lb (78.019 kg)  BMI 25.95 kg/m2  SpO2 98% General appearance: alert, cooperative and appears stated age Head: Normocephalic, without obvious abnormality, atraumatic Eyes: conjunctivae/corneas clear. PERRL, EOM's intact. Fundi benign. Ears: normal TM's and external ear canals both ears Nose: Nares normal. Septum midline. Mucosa normal. No drainage or sinus tenderness. Throat: lips, mucosa, and tongue normal; teeth and gums normal Neck: no adenopathy, no carotid bruit, no JVD, supple, symmetrical, trachea midline and thyroid not enlarged, symmetric, no tenderness/mass/nodules Lungs: clear to auscultation  bilaterally Breasts: normal appearance, no masses or tenderness Heart: regular rate and rhythm, S1, S2 normal, no murmur, click, rub or gallop Abdomen: soft, non-tender; bowel sounds normal; no masses,  no organomegaly Extremities: extremities normal, atraumatic, no cyanosis or edema Pulses: 2+ and symmetric Skin: Skin color, texture, turgor normal. No rashes or lesions Neurologic: Alert and oriented X 3, normal strength and tone. Normal symmetric reflexes. Normal coordination and gait.   .    Assessment and Plan:    Family history of blood clots She was screened for inherited mutations with Factor V Leiden testing in Sept 2013 which was negative  Screening for cervical cancer We have resumed screening every 3 years.  She is due 2016   Hip pain, chronic Exam was benign. Plain filns of hip and lumbar spine were normal and did not suggest DDD or DJD.  Suspect trochanteric bursitis and will offer referral to orthopedics for hip injection  Routine general medical examination at a health care facility Annual wellness  exam was done as well as a comprehensive physical exam and management of acute and chronic conditions .  Health maintenance screenings have ben addressed and hand out given .   Screening for hyperlipidemia Fasting lipids were normal in 2013 and will be repeated this year.   History of abnormal cervical Papanicolaou smear S/p LEEP procedure remotely,  Over 10 years ago.   With her last 3 PAPS  done every other year by me,  normal, last one 2013.  Will change to every 3 years     Updated Medication List Outpatient Encounter Prescriptions as of 08/27/2014  Medication Sig  . Cholecalciferol (VITAMIN D3) 1000 UNITS CAPS Take 1 capsule by mouth daily.  Marland Kitchen gabapentin (NEURONTIN) 100 MG capsule Take 1 capsule (100 mg total) by mouth 3 (three) times daily.  . [DISCONTINUED] meloxicam (MOBIC) 15 MG tablet Take 1 tablet (15 mg total) by mouth daily.  . [DISCONTINUED]  methylPREDNIsolone (MEDROL DOSPACK) 4 MG tablet follow package directions

## 2014-08-27 NOTE — Progress Notes (Signed)
Pre-visit discussion using our clinic review tool. No additional management support is needed unless otherwise documented below in the visit note.  

## 2014-08-30 NOTE — Assessment & Plan Note (Signed)
She was screened for inherited mutations with Factor V Leiden testing in Sept 2013 which was negative

## 2014-08-30 NOTE — Assessment & Plan Note (Signed)
S/p LEEP procedure remotely,  Over 10 years ago.   With her last 3 PAPS  done every other year by me, normal, last one 2013.  Will change to every 3 years

## 2014-08-30 NOTE — Assessment & Plan Note (Signed)
Annual wellness  exam was done as well as a comprehensive physical exam and management of acute and chronic conditions .  Health maintenance screenings have ben addressed and hand out given .  

## 2014-08-30 NOTE — Assessment & Plan Note (Addendum)
We have resumed screening every 3 years.  She is due 2016

## 2014-08-30 NOTE — Assessment & Plan Note (Signed)
Fasting lipids were normal in 2013 and will be repeated this year.

## 2014-08-30 NOTE — Assessment & Plan Note (Signed)
Exam was benign. Plain filns of hip and lumbar spine were normal and did not suggest DDD or DJD.  Suspect trochanteric bursitis and will offer referral to orthopedics for hip injection

## 2014-09-01 ENCOUNTER — Telehealth: Payer: Self-pay | Admitting: Internal Medicine

## 2014-09-01 DIAGNOSIS — Z13820 Encounter for screening for osteoporosis: Secondary | ICD-10-CM

## 2014-09-01 DIAGNOSIS — G8929 Other chronic pain: Secondary | ICD-10-CM

## 2014-09-01 DIAGNOSIS — M25559 Pain in unspecified hip: Principal | ICD-10-CM

## 2014-09-01 NOTE — Telephone Encounter (Signed)
Bone Density Test and sports medicine referrals placed for this patient

## 2014-09-01 NOTE — Telephone Encounter (Signed)
Message copied by Crecencio Mc on Tue Sep 01, 2014 12:29 PM ------      Message from: Wynonia Lawman E      Created: Tue Sep 01, 2014 10:43 AM       Called and advised patient of results, verbalized understanding. Agreeable to DEXA and sports med referral, no preference. Advised [facility] will call once appointments have been scheduled. ------

## 2014-09-14 ENCOUNTER — Encounter: Payer: Self-pay | Admitting: Internal Medicine

## 2014-09-15 ENCOUNTER — Other Ambulatory Visit (INDEPENDENT_AMBULATORY_CARE_PROVIDER_SITE_OTHER): Payer: No Typology Code available for payment source

## 2014-09-15 ENCOUNTER — Ambulatory Visit (INDEPENDENT_AMBULATORY_CARE_PROVIDER_SITE_OTHER): Payer: No Typology Code available for payment source | Admitting: Family Medicine

## 2014-09-15 ENCOUNTER — Encounter: Payer: Self-pay | Admitting: Family Medicine

## 2014-09-15 VITALS — BP 122/82 | HR 70 | Ht 68.0 in | Wt 172.0 lb

## 2014-09-15 DIAGNOSIS — M7062 Trochanteric bursitis, left hip: Secondary | ICD-10-CM

## 2014-09-15 DIAGNOSIS — M25559 Pain in unspecified hip: Secondary | ICD-10-CM

## 2014-09-15 DIAGNOSIS — M25552 Pain in left hip: Secondary | ICD-10-CM

## 2014-09-15 DIAGNOSIS — M76899 Other specified enthesopathies of unspecified lower limb, excluding foot: Secondary | ICD-10-CM

## 2014-09-15 HISTORY — DX: Trochanteric bursitis, left hip: M70.62

## 2014-09-15 NOTE — Progress Notes (Signed)
[facility] [address] [place], [place] [address] Phone: [phone] Subjective:    I'm seeing this patient by the request  of:  TULLO,TERESA, MD   CC: Low back pain and hip pain  [id number] Maria [name] is a 53 y.o. female coming in with complaint of left hip pain. Patient actually states that her low back only hurts when her hip is giving her significant discomfort. Patient has had this pain for quite some time. Patient states that it is a dull aching sensation that is worse at night. Patient states that if she lays on that side it can wake her up. Mild radiation of the lateral aspect of the leg and does notice some tightness. Denies any groin pain. Describes it is more of a dull aching and can be throbbing. Denies any weakness of a layer any numbness or tingling. Patient is a severity is 6/10.     Past medical history, social, surgical and family history all reviewed in electronic medical record.   Review of Systems: No headache, visual changes, nausea, vomiting, diarrhea, constipation, dizziness, abdominal pain, skin rash, fevers, chills, night sweats, weight loss, swollen lymph nodes, body aches, joint swelling, muscle aches, chest pain, shortness of breath, mood changes.   Objective Blood pressure 122/82, pulse 70, height 5\' 8"  (1.727 m), weight 172 lb (78.019 kg), SpO2 96.00%.  General: No apparent distress alert and oriented x3 mood and affect normal, dressed appropriately.  HEENT: Pupils equal, extraocular movements intact  Respiratory: Patient's speak in full sentences and does not appear short of breath  Cardiovascular: No lower extremity edema, non tender, no erythema  Skin: Warm dry intact with no signs of infection or rash on extremities or on axial skeleton.  Abdomen: Soft nontender  Neuro: Cranial nerves II through XII are intact, neurovascularly intact in all extremities with 2+ DTRs and 2+ pulses.  Lymph: No lymphadenopathy  of posterior or anterior cervical chain or axillae bilaterally.  Gait normal with good balance and coordination.  MSK:  Non tender with full range of motion and good stability and symmetric strength and tone of shoulders, elbows, wrist, hip, knee and ankles bilaterally.  Back Exam:  Inspection: Unremarkable  Motion: Flexion 45 deg, Extension 45 deg, Side Bending to 45 deg bilaterally,  Rotation to 45 deg bilaterally  SLR laying: Negative  XSLR laying: Negative  Palpable tenderness: None. MSK US performed of: Right This study was ordered, performed, and interpreted by Charlann Boxer D.O.  Hip: Trochanteric bursa with significant hypoechoic changes and swelling Acetabular labrum visualized and without tears, displacement, or effusion in joint. Femoral neck appears unremarkable without increased power doppler signal along Cortex.  IMPRESSION:  Greater trochanter bursitis   Procedure: Real-time Ultrasound Guided Injection of right greater trochanteric bursitis secondary to patient's body habitus Device: GE Logiq E  Ultrasound guided injection is preferred based studies that show increased duration, increased effect, greater accuracy, decreased procedural pain, increased response rate, and decreased cost with ultrasound guided versus blind injection.  Verbal informed consent obtained.  Time-out conducted.  Noted no overlying erythema, induration, or other signs of local infection.  Skin prepped in a sterile fashion.  Local anesthesia: Topical Ethyl chloride.  With sterile technique and under real time ultrasound guidance:  Greater trochanteric area was visualized and patient's bursa was noted. A 22-gauge 3 inch needle was inserted and 4 cc of 0.5% Marcaine and 1 cc of Kenalog 40 mg/dL was injected. Pictures taken Completed without difficulty  Pain immediately resolved suggesting accurate placement of the medication.  Advised to call if fevers/chills, erythema, induration, drainage, or  persistent bleeding.  Images permanently stored and available for review in the ultrasound unit.  Impression: Technically successful ultrasound guided injection.      Impression and Recommendations:     This case required medical decision making of moderate complexity.

## 2014-09-15 NOTE — Patient Instructions (Addendum)
Very nice to meet you Ice 20 minutes 2 times daily. Usually after activity and before bed. Exercises 3 times a week.  Exercises on wall.  Heel and butt touching.  Raise leg 6 inches and hold 2 seconds.  Down slow for count of 4 seconds.  1 set of 30 reps daily on both sides.  Vitamin D increase to 2000 IU daily Consider turmeric 500mg  twice daily.  Glucosamine 1500mg  daily Come back again in 3-4 weeks.

## 2014-09-15 NOTE — Assessment & Plan Note (Signed)
The patient was given an injection today. Patient tolerated the procedure very well. We discussed icing protocol which I think will be very helpful. Patient was given home exercise program insure proper techniques. We discussed over-the-counter medicines I think would be helpful as well. Patient will work on his muscle imbalances and then come back and see me again in 3-4 weeks for further evaluation and treatment.

## 2014-09-16 ENCOUNTER — Ambulatory Visit: Payer: No Typology Code available for payment source | Admitting: Family Medicine

## 2014-09-26 LAB — TSH: TSH: 0.85 u[IU]/mL (ref 0.41–5.90)

## 2014-09-26 LAB — CBC AND DIFFERENTIAL
HEMATOCRIT: 40 % (ref 36–46)
Hemoglobin: 13.9 g/dL (ref 12.0–16.0)
Neutrophils Absolute: 4 /uL
PLATELETS: 324 10*3/uL (ref 150–399)
WBC: 6.1 10^3/mL

## 2014-09-26 LAB — LIPID PANEL
CHOLESTEROL: 188 mg/dL (ref 0–200)
HDL: 74 mg/dL — AB (ref 35–70)
LDL Cholesterol: 106 mg/dL
Triglycerides: 40 mg/dL (ref 40–160)

## 2014-09-26 LAB — HEPATIC FUNCTION PANEL
ALT: 12 U/L (ref 7–35)
AST: 14 U/L (ref 13–35)
Alkaline Phosphatase: 101 U/L (ref 25–125)
BILIRUBIN, TOTAL: 0.4 mg/dL

## 2014-09-26 LAB — BASIC METABOLIC PANEL
BUN: 18 mg/dL (ref 4–21)
Creatinine: 0.8 mg/dL (ref 0.5–1.1)
Glucose: 91 mg/dL
Potassium: 4.6 mmol/L (ref 3.4–5.3)
SODIUM: 140 mmol/L (ref 137–147)

## 2014-09-29 ENCOUNTER — Encounter: Payer: Self-pay | Admitting: *Deleted

## 2014-09-29 ENCOUNTER — Telehealth: Payer: Self-pay | Admitting: Internal Medicine

## 2014-09-29 NOTE — Telephone Encounter (Signed)
Letter mailed

## 2014-09-29 NOTE — Telephone Encounter (Signed)
Labs received and reveiewed.  Please abstract those that can be abstracted.  Your vitamin D is slightly low but your cholesterol, CBC, thyroid , liver and kidney function are normal.  You do not need any medication changes. Please continue your current medications and plan to repeat in one year.  You should be taking 1000 units of OTC D3 daily, .    Regards,   Dr. Derrel Nip

## 2015-11-01 ENCOUNTER — Other Ambulatory Visit (HOSPITAL_COMMUNITY)
Admission: RE | Admit: 2015-11-01 | Discharge: 2015-11-01 | Disposition: A | Discharge status: Home / Self Care | Payer: No Typology Code available for payment source | Source: Ambulatory Visit | Attending: Internal Medicine | Admitting: Internal Medicine

## 2015-11-01 ENCOUNTER — Ambulatory Visit (INDEPENDENT_AMBULATORY_CARE_PROVIDER_SITE_OTHER): Payer: No Typology Code available for payment source | Admitting: Internal Medicine

## 2015-11-01 ENCOUNTER — Encounter: Payer: Self-pay | Admitting: Internal Medicine

## 2015-11-01 VITALS — BP 120/78 | HR 73 | Temp 98.1°F | Resp 12 | Ht 67.5 in | Wt 175.4 lb

## 2015-11-01 DIAGNOSIS — N760 Acute vaginitis: Secondary | ICD-10-CM | POA: Insufficient documentation

## 2015-11-01 DIAGNOSIS — Z01411 Encounter for gynecological examination (general) (routine) with abnormal findings: Secondary | ICD-10-CM | POA: Insufficient documentation

## 2015-11-01 DIAGNOSIS — Z0000 Encounter for general adult medical examination without abnormal findings: Secondary | ICD-10-CM

## 2015-11-01 DIAGNOSIS — D126 Benign neoplasm of colon, unspecified: Secondary | ICD-10-CM | POA: Insufficient documentation

## 2015-11-01 DIAGNOSIS — Z1151 Encounter for screening for human papillomavirus (HPV): Secondary | ICD-10-CM | POA: Insufficient documentation

## 2015-11-01 DIAGNOSIS — N898 Other specified noninflammatory disorders of vagina: Secondary | ICD-10-CM | POA: Diagnosis not present

## 2015-11-01 DIAGNOSIS — Z1239 Encounter for other screening for malignant neoplasm of breast: Secondary | ICD-10-CM

## 2015-11-01 DIAGNOSIS — Z113 Encounter for screening for infections with a predominantly sexual mode of transmission: Secondary | ICD-10-CM | POA: Diagnosis present

## 2015-11-01 DIAGNOSIS — Z124 Encounter for screening for malignant neoplasm of cervix: Secondary | ICD-10-CM

## 2015-11-01 NOTE — Progress Notes (Signed)
Pre visit review using our clinic review tool, if applicable. No additional management support is needed unless otherwise documented below in the visit note. 

## 2015-11-01 NOTE — Patient Instructions (Signed)

## 2015-11-01 NOTE — Progress Notes (Signed)
Patient ID: Maria [name], female    DOB: [birth year]  Age: 54 y.o. MRN: [id number]  The patient is here for annual Medicare wellness examination and management of other chronic and acute problems.   Normal PAP 2013 Tubular adenoma 2013 colonoscopy  mammogram normal August 2015 Hot flashes  The risk factors are reflected in the social history.  The roster of all physicians providing medical care to patient - is listed in the Snapshot section of the chart.  Home safety : The patient has smoke detectors in the home. They wear seatbelts.  There are no firearms at home. There is no violence in the home.   There is no risks for hepatitis, STDs or HIV. There is no   history of blood transfusion. They have no travel history to infectious disease endemic areas of the world.  The patient has seen their dentist in the last six month. They have seen their eye doctor in the last year. They have no  hearing difficulty with regard to whispered voices .  They have deferred audiologic testing in the last year.  They do not  have excessive sun exposure. Discussed the need for sun protection: hats, long sleeves and use of sunscreen if there is significant sun exposure.   Diet: the importance of a healthy diet is discussed. They do have a healthy diet.  The benefits of regular aerobic exercise were discussed. She walks 4 times per week ,  20 minutes.   Depression screen: there are no signs or vegative symptoms of depression- irritability, change in appetite, anhedonia, sadness/tearfullness.  The following portions of the patient's history were reviewed and updated as appropriate: allergies, current medications, past family history, past medical history,  past surgical history, past social history  and problem list.  Visual acuity was not assessed per patient preference since she has regular follow up with her ophthalmologist. Hearing and body mass index were assessed and reviewed.   During the course of  the visit the patient was educated and counseled about appropriate screening and preventive services including : fall prevention , diabetes screening, nutrition counseling, colorectal cancer screening, and recommended immunizations.    CC: The primary encounter diagnosis was Tubular adenoma of colon. Diagnoses of Screening for cervical cancer, Vaginal discharge, Breast cancer screening, and Visit for preventive health examination were also pertinent to this visit.  History Maria [name] has a past medical history of Abnormal cervical Papanicolaou smear (1995) and Ovarian cyst (1993).   She has no past surgical history on file.   Her family history includes Cancer (age of onset: 63) in her maternal aunt; Clotting disorder in her father; Diabetes in her mother; Heart disease in her mother; Hyperlipidemia in her mother; Multiple sclerosis in her paternal grandmother; Parkinson's disease in her brother; Stroke in her father.She reports that she has never smoked. She has never used smokeless tobacco. She reports that she drinks about 2.4 oz of alcohol per week. She reports that she does not use illicit drugs.  Outpatient Prescriptions Prior to Visit  Medication Sig Dispense Refill  . Cholecalciferol (VITAMIN D3) 1000 UNITS CAPS Take 1 capsule by mouth daily.    Marland Kitchen gabapentin (NEURONTIN) 100 MG capsule Take 1 capsule (100 mg total) by mouth 3 (three) times daily. (Patient not taking: Reported on 11/01/2015) 90 capsule 3   No facility-administered medications prior to visit.    Review of Systems  Patient denies headache, fevers, malaise, unintentional weight loss, skin rash, eye pain, sinus congestion and sinus pain,  sore throat, dysphagia,  hemoptysis , cough, dyspnea, wheezing, chest pain, palpitations, orthopnea, edema, abdominal pain, nausea, melena, diarrhea, constipation, flank pain, dysuria, hematuria, urinary  Frequency, nocturia, numbness, tingling, seizures,  Focal weakness, Loss of consciousness,   Tremor, insomnia, depression, anxiety, and suicidal ideation.     Objective:  BP 120/78 mmHg  Pulse 73  Temp(Src) 98.1 F (36.7 C) (Oral)  Resp 12  Ht 5' 7.5" (1.715 m)  Wt 175 lb 6.4 oz (79.561 kg)  BMI 27.05 kg/m2  SpO2 98%  LMP 06/22/2013  Physical Exam   General Appearance:    Alert, cooperative, no distress, appears stated age  Head:    Normocephalic, without obvious abnormality, atraumatic  Eyes:    PERRL, conjunctiva/corneas clear, EOM's intact, fundi    benign, both eyes  Ears:    Normal TM's and external ear canals, both ears  Nose:   Nares normal, septum midline, mucosa normal, no drainage    or sinus tenderness  Throat:   Lips, mucosa, and tongue normal; teeth and gums normal  Neck:   Supple, symmetrical, trachea midline, no adenopathy;    thyroid:  no enlargement/tenderness/nodules; no carotid   bruit or JVD  Back:     Symmetric, no curvature, ROM normal, no CVA tenderness  Lungs:     Clear to auscultation bilaterally, respirations unlabored  Chest Wall:    No tenderness or deformity   Heart:    Regular rate and rhythm, S1 and S2 normal, no murmur, rub   or gallop  Breast Exam:    No tenderness, masses, or nipple abnormality  Abdomen:     Soft, non-tender, bowel sounds active all four quadrants,    no masses, no organomegaly  Genitalia:    Pelvic: cervix normal in appearance, external genitalia normal, no adnexal masses or tenderness, no cervical motion tenderness, rectovaginal septum normal, uterus normal size, shape, and consistency and vagina normal without discharge  Extremities:   Extremities normal, atraumatic, no cyanosis or edema  Pulses:   2+ and symmetric all extremities  Skin:   Skin color, texture, turgor normal, no rashes or lesions  Lymph nodes:   Cervical, supraclavicular, and axillary nodes normal  Neurologic:   CNII-XII intact, normal strength, sensation and reflexes    throughout      Assessment & Plan:   Problem List Items Addressed This  Visit    Visit for preventive health examination    Annual comprehensive preventive exam was done as well as an evaluation and management of chronic conditions .  During the course of the visit the patient was educated and counseled about appropriate screening and preventive services including :  breast cancer and cervical cancer screening diabetes screening, lipid analysis , nutrition counseling, screening for Hep C and HIV per CDC guidelines,  and recommended immunizations.  Printed recommendations for health maintenance screenings was given.       Tubular adenoma of colon - Primary   Screening for cervical cancer   Relevant Orders   Cytology - PAP    Other Visit Diagnoses    Vaginal discharge        Relevant Orders    Cytology - PAP    Breast cancer screening        Relevant Orders    MM DIGITAL SCREENING BILATERAL       I am having Ms. Tomasini maintain her Vitamin D3 and gabapentin.  No orders of the defined types were placed in this encounter.    There are  no discontinued medications.  Follow-up: No Follow-up on file.   Crecencio Mc, MD

## 2015-11-02 ENCOUNTER — Encounter: Payer: Self-pay | Admitting: Internal Medicine

## 2015-11-02 NOTE — Assessment & Plan Note (Signed)
Annual comprehensive preventive exam was done as well as an evaluation and management of chronic conditions .  During the course of the visit the patient was educated and counseled about appropriate screening and preventive services including :  breast cancer and cervical cancer screening diabetes screening, lipid analysis , nutrition counseling, screening for Hep C and HIV per CDC guidelines,  and recommended immunizations.  Printed recommendations for health maintenance screenings was given.

## 2015-11-03 LAB — CYTOLOGY - PAP

## 2015-11-04 LAB — CERVICOVAGINAL ANCILLARY ONLY: Candida vaginitis: NEGATIVE

## 2015-11-05 ENCOUNTER — Encounter: Payer: Self-pay | Admitting: Internal Medicine

## 2015-11-05 ENCOUNTER — Other Ambulatory Visit: Payer: Self-pay | Admitting: Internal Medicine

## 2015-11-05 DIAGNOSIS — N760 Acute vaginitis: Principal | ICD-10-CM

## 2015-11-05 DIAGNOSIS — B9689 Other specified bacterial agents as the cause of diseases classified elsewhere: Secondary | ICD-10-CM | POA: Insufficient documentation

## 2015-11-05 MED ORDER — METRONIDAZOLE 500 MG PO TABS: 500.0000 mg | ORAL_TABLET | Freq: Three times a day (TID) | ORAL | Status: DC

## 2015-11-08 ENCOUNTER — Other Ambulatory Visit: Payer: Self-pay

## 2015-11-08 DIAGNOSIS — Z299 Encounter for prophylactic measures, unspecified: Secondary | ICD-10-CM

## 2015-11-08 LAB — BASIC METABOLIC PANEL
BUN: 18 mg/dL (ref 4–21)
Creatinine: 0.7 mg/dL (ref 0.5–1.1)
Glucose: 96 mg/dL
POTASSIUM: 4.6 mmol/L (ref 3.4–5.3)
SODIUM: 142 mmol/L (ref 137–147)

## 2015-11-08 LAB — HEPATIC FUNCTION PANEL
ALK PHOS: 111 U/L (ref 25–125)
ALT: 8 U/L (ref 7–35)
AST: 17 U/L (ref 13–35)
Bilirubin, Total: 0.5 mg/dL

## 2015-11-08 LAB — LIPID PANEL
Cholesterol: 191 mg/dL (ref 0–200)
HDL: 66 mg/dL (ref 35–70)
Triglycerides: 58 mg/dL (ref 40–160)

## 2015-11-08 NOTE — Progress Notes (Signed)
Patient came in to have blood drawn per her physician, Dr. Derrel Nip at [facility].  Blood was drawn from right arm without any incident.

## 2015-11-09 LAB — CMP12+LP+TP+TSH+6AC+CBC/D/PLT
ALBUMIN: 4.2 g/dL (ref 3.5–5.5)
ALK PHOS: 112 IU/L (ref 39–117)
ALT: 8 IU/L (ref 0–32)
AST: 17 IU/L (ref 0–40)
Albumin/Globulin Ratio: 1.6 (ref 1.1–2.5)
BASOS: 1 %
BUN / CREAT RATIO: 18 (ref 9–23)
BUN: 13 mg/dL (ref 6–24)
Basophils Absolute: 0 10*3/uL (ref 0.0–0.2)
Bilirubin Total: 0.5 mg/dL (ref 0.0–1.2)
CALCIUM: 9.9 mg/dL (ref 8.7–10.2)
CHOLESTEROL TOTAL: 191 mg/dL (ref 100–199)
Chloride: 103 mmol/L (ref 97–106)
Chol/HDL Ratio: 2.9 ratio units (ref 0.0–4.4)
Creatinine, Ser: 0.71 mg/dL (ref 0.57–1.00)
EOS (ABSOLUTE): 0.1 10*3/uL (ref 0.0–0.4)
EOS: 2 %
FREE THYROXINE INDEX: 2.1 (ref 1.2–4.9)
GFR calc Af Amer: 111 mL/min/{1.73_m2} (ref >59)
GFR calc non Af Amer: 96 mL/min/{1.73_m2} (ref >59)
GGT: 13 IU/L (ref 0–60)
Globulin, Total: 2.6 g/dL (ref 1.5–4.5)
Glucose: 96 mg/dL (ref 65–99)
HDL: 66 mg/dL (ref >39)
Hematocrit: 39.8 % (ref 34.0–46.6)
Hemoglobin: 13.5 g/dL (ref 11.1–15.9)
IMMATURE GRANULOCYTES: 0 %
IRON: 97 ug/dL (ref 27–159)
Immature Grans (Abs): 0 10*3/uL (ref 0.0–0.1)
LDH: 171 IU/L (ref 119–226)
LDL Calculated: 113 mg/dL — ABNORMAL HIGH (ref 0–99)
LYMPHS ABS: 1.3 10*3/uL (ref 0.7–3.1)
Lymphs: 32 %
MCH: 30.2 pg (ref 26.6–33.0)
MCHC: 33.9 g/dL (ref 31.5–35.7)
MCV: 89 fL (ref 79–97)
Monocytes Absolute: 0.2 10*3/uL (ref 0.1–0.9)
Monocytes: 5 %
NEUTROS ABS: 2.5 10*3/uL (ref 1.4–7.0)
NEUTROS PCT: 60 %
POTASSIUM: 4.6 mmol/L (ref 3.5–5.2)
Phosphorus: 3.3 mg/dL (ref 2.5–4.5)
Platelets: 315 10*3/uL (ref 150–379)
RBC: 4.47 x10E6/uL (ref 3.77–5.28)
RDW: 13 % (ref 12.3–15.4)
SODIUM: 142 mmol/L (ref 136–144)
T3 UPTAKE RATIO: 24 % (ref 24–39)
T4, Total: 8.9 ug/dL (ref 4.5–12.0)
TSH: 1.42 u[IU]/mL (ref 0.450–4.500)
Total Protein: 6.8 g/dL (ref 6.0–8.5)
Triglycerides: 58 mg/dL (ref 0–149)
Uric Acid: 4.6 mg/dL (ref 2.5–7.1)
VLDL Cholesterol Cal: 12 mg/dL (ref 5–40)
WBC: 4 10*3/uL (ref 3.4–10.8)

## 2015-11-09 LAB — HIV ANTIBODY (ROUTINE TESTING W REFLEX): HIV Screen 4th Generation wRfx: NONREACTIVE

## 2015-11-09 LAB — VITAMIN D 25 HYDROXY (VIT D DEFICIENCY, FRACTURES): Vit D, 25-Hydroxy: 26.1 ng/mL — ABNORMAL LOW (ref 30.0–100.0)

## 2015-11-09 LAB — HEPATITIS C ANTIBODY (REFLEX)

## 2015-11-09 LAB — HCV COMMENT:

## 2015-11-16 ENCOUNTER — Encounter: Payer: Self-pay | Admitting: Internal Medicine

## 2015-11-16 ENCOUNTER — Encounter: Payer: Self-pay | Admitting: *Deleted

## 2015-11-16 ENCOUNTER — Telehealth: Payer: Self-pay | Admitting: Internal Medicine

## 2015-11-16 DIAGNOSIS — E559 Vitamin D deficiency, unspecified: Secondary | ICD-10-CM | POA: Insufficient documentation

## 2015-11-16 NOTE — Telephone Encounter (Signed)
The faxed labs from [facility] were incomplete.  Page 2 missing  pls call [phone].  Lipids and complete metabolic panel look fine  and were abstracted.

## 2015-11-16 NOTE — Telephone Encounter (Signed)
Letter mailed and [facility] chart message sent.

## 2015-11-16 NOTE — Assessment & Plan Note (Signed)
Level was 26

## 2015-11-16 NOTE — Telephone Encounter (Signed)
Your screening tests for HIV and Hepatitis C were negative/normal.  Your  Thyroid, cholesterol, CBC ,  liver and kidney function are normal. Your vitamin D is borderline low.  If you are not taking a D3 supplement,  please start taking 1000 [place] daily.  If you are ,  Please increase to 2000 IUs daily for the winter months. . Low Vitamin D can increase your risk of weak bones and fractures and interfere with your body's ability to absorb the calcium in your diet  Please continue your current medications and plan to repeat the labs in 1 year.    Regards,   Dr. Derrel Nip

## 2015-11-16 NOTE — Telephone Encounter (Signed)
Page 2 in [employer] .

## 2016-01-24 ENCOUNTER — Encounter: Payer: Self-pay | Admitting: Internal Medicine

## 2016-01-25 NOTE — Telephone Encounter (Signed)
Maria [name] can you set up her mammogram?

## 2016-02-02 ENCOUNTER — Ambulatory Visit
Admission: RE | Admit: 2016-02-02 | Discharge: 2016-02-02 | Disposition: A | Discharge status: Home / Self Care | Payer: Managed Care, Other (non HMO) | Source: Ambulatory Visit | Attending: Internal Medicine | Admitting: Internal Medicine

## 2016-02-02 DIAGNOSIS — Z1231 Encounter for screening mammogram for malignant neoplasm of breast: Secondary | ICD-10-CM | POA: Diagnosis present

## 2016-02-02 DIAGNOSIS — Z1239 Encounter for other screening for malignant neoplasm of breast: Secondary | ICD-10-CM

## 2016-05-20 IMAGING — CR DG LUMBAR SPINE COMPLETE 4+V
5 series · 5 of 5 positions shown · non-contrast
Comparison: None

CLINICAL DATA: Persistent back pain, hip pain, LEFT thigh numbness
and radiculopathy, sciatic neuralgia LEFT

EXAM:
LUMBAR SPINE - COMPLETE 4+ VIEW

[view not recorded (1 of 5)]
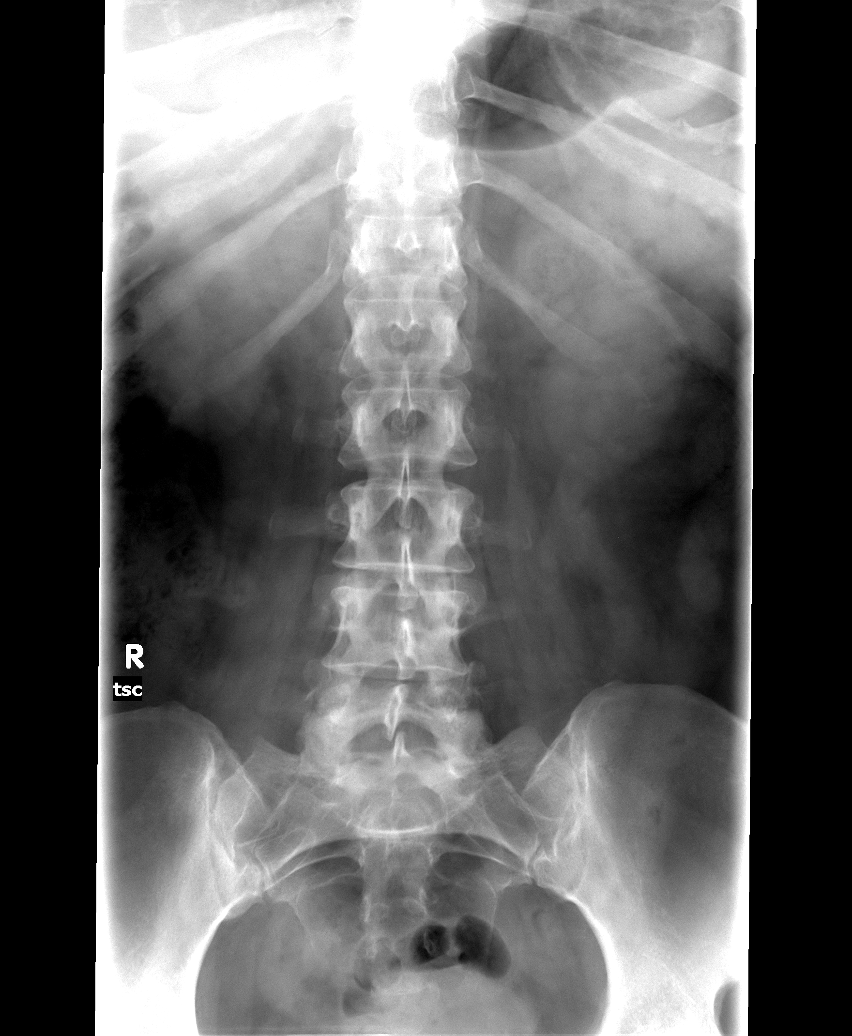

[view not recorded (2 of 5)]
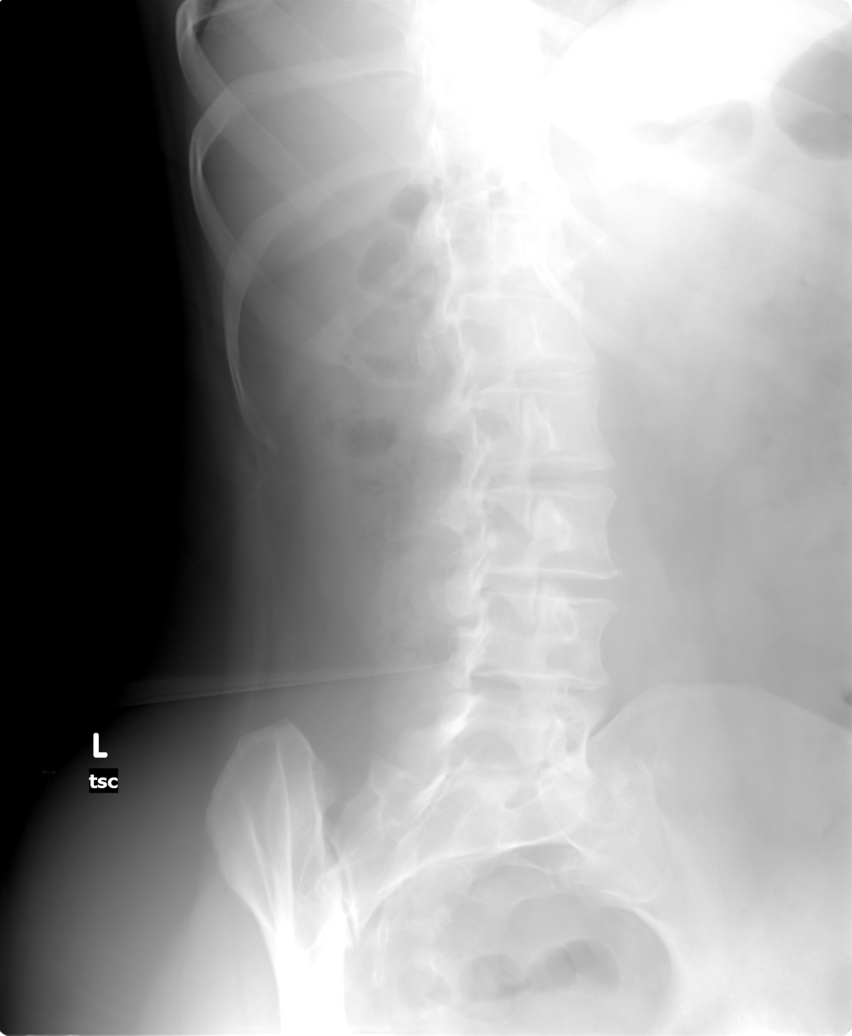

[view not recorded (3 of 5)]
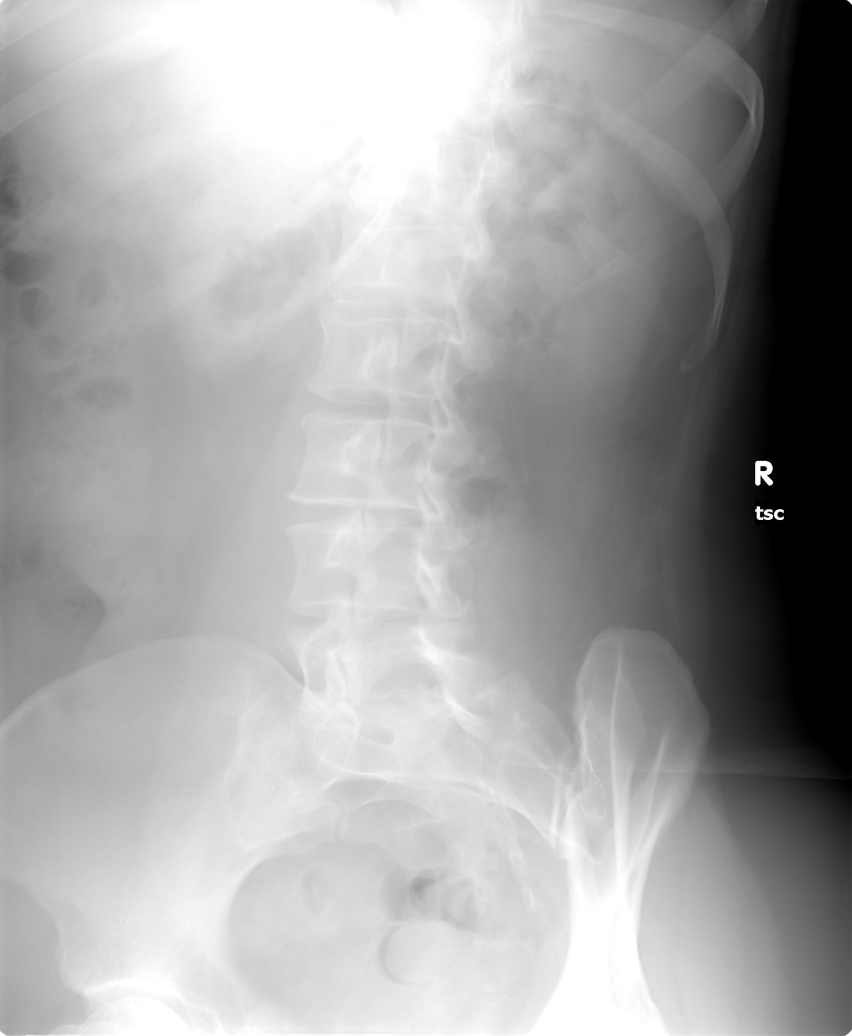

[view not recorded (4 of 5)]
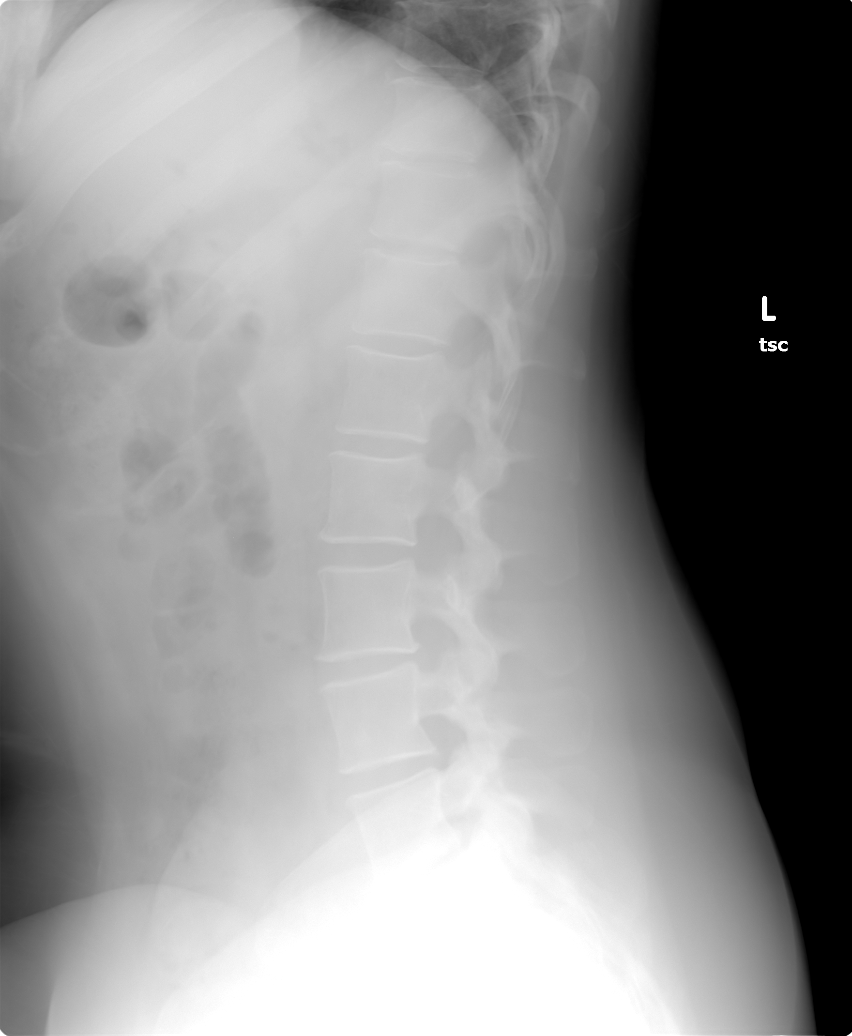

[view not recorded (5 of 5)]
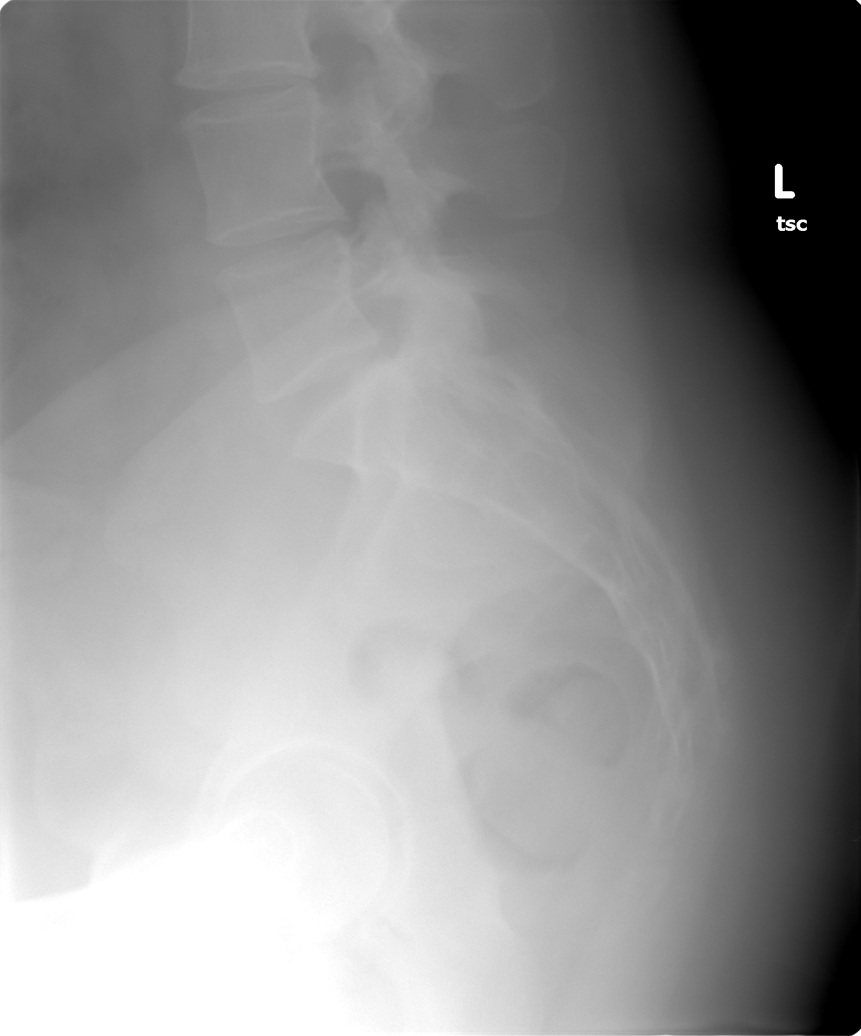

[5 of 5 positions shown; findings below may reference images not displayed]

FINDINGS: Five non-rib-bearing lumbar vertebrae.

Diffuse osseous demineralization.

Vertebral body heights maintained.

No acute fracture, subluxation or bone destruction.

No spondylolysis.

SI joints symmetric.
IMPRESSION: No acute osseous abnormalities.

Osseous demineralization.

## 2017-01-15 ENCOUNTER — Telehealth: Payer: Self-pay | Admitting: Internal Medicine

## 2017-01-15 ENCOUNTER — Other Ambulatory Visit: Payer: Self-pay | Admitting: Internal Medicine

## 2017-01-15 DIAGNOSIS — Z1231 Encounter for screening mammogram for malignant neoplasm of breast: Secondary | ICD-10-CM

## 2017-01-15 NOTE — Telephone Encounter (Signed)
Mammogram ordered and patient notified.

## 2017-01-15 NOTE — Telephone Encounter (Signed)
Pt would like to get her mammo done pt goes to [facility] in [place]. Order needed please and thank you!

## 2017-01-22 ENCOUNTER — Ambulatory Visit: Payer: Self-pay | Admitting: Physician Assistant

## 2017-01-22 ENCOUNTER — Encounter: Payer: Self-pay | Admitting: Physician Assistant

## 2017-01-22 VITALS — BP 114/88 | HR 85 | Temp 98.4°F

## 2017-01-22 DIAGNOSIS — R509 Fever, unspecified: Secondary | ICD-10-CM

## 2017-01-22 LAB — POCT INFLUENZA A/B
Influenza A, POC: NEGATIVE
Influenza B, POC: NEGATIVE

## 2017-01-22 MED ORDER — AZITHROMYCIN 250 MG PO TABS: ORAL_TABLET | ORAL | 0 refills | Status: DC

## 2017-01-22 NOTE — Progress Notes (Signed)
S: C/o runny nose and congestion with dry cough for 3 days, + fever, chills since sat, (less than 48 hours), denies cp/sob, v/d; mucus was green this am but clear throughout the day, cough is sporadic, does have some body aches, was exposed to a pt that had influenza B  Using otc meds: mucinex, dimetapp, alka seltzer plus, tylenol  O: PE: vitals wnl, nad,  perrl eomi, normocephalic, tms dull, nasal mucosa red and swollen, throat injected, neck supple no lymph, lungs c t a, cv rrr, neuro intact, flu swab   A:  Acute flu like illness   P: drink fluids, continue regular meds , use otc meds of choice, return if not improving in 5 days, return earlier if worsening

## 2017-01-25 ENCOUNTER — Ambulatory Visit (INDEPENDENT_AMBULATORY_CARE_PROVIDER_SITE_OTHER): Payer: Managed Care, Other (non HMO) | Admitting: Internal Medicine

## 2017-01-25 ENCOUNTER — Encounter: Payer: Self-pay | Admitting: Internal Medicine

## 2017-01-25 VITALS — BP 112/80 | HR 72 | Temp 98.1°F | Resp 16 | Ht 68.0 in | Wt 177.0 lb

## 2017-01-25 DIAGNOSIS — Z1231 Encounter for screening mammogram for malignant neoplasm of breast: Secondary | ICD-10-CM | POA: Diagnosis not present

## 2017-01-25 DIAGNOSIS — Z0000 Encounter for general adult medical examination without abnormal findings: Secondary | ICD-10-CM | POA: Diagnosis not present

## 2017-01-25 DIAGNOSIS — Z1239 Encounter for other screening for malignant neoplasm of breast: Secondary | ICD-10-CM

## 2017-01-25 MED ORDER — OSELTAMIVIR PHOSPHATE 75 MG PO CAPS: 75.0000 mg | ORAL_CAPSULE | Freq: Every day | ORAL | 0 refills | Status: DC

## 2017-01-25 NOTE — Progress Notes (Signed)
Patient ID: Maria [name], female    DOB: May 18, 1961  Age: 56 y.o. MRN: [id number]  The patient is here for annual  examination and management of other chronic and acute problems.   pap normal 2016 Tubular adenoma 2013.  5 yr follow up due this year Mammogram ordered   The risk factors are reflected in the social history.  The roster of all physicians providing medical care to patient - is listed in the Snapshot section of the chart.   Home safety : The patient has smoke detectors in the home. They wear seatbelts.  There are no firearms at home. There is no violence in the home.   There is no risks for hepatitis, STDs or HIV. There is no   history of blood transfusion. They have no travel history to infectious disease endemic areas of the world.  The patient has seen their dentist in the last six month. They have seen their eye doctor in the last year. They do not  have excessive sun exposure. Discussed the need for sun protection: hats, long sleeves and use of sunscreen if there is significant sun exposure.   Diet: the importance of a healthy diet is discussed. They do have a healthy diet.  The benefits of regular aerobic exercise were discussed. She walks 4 times per week ,  20 minutes.   Depression screen: there are no signs or vegative symptoms of depression- irritability, change in appetite, anhedonia, sadness/tearfullness. .  The following portions of the patient's history were reviewed and updated as appropriate: allergies, current medications, past family history, past medical history,  past surgical history, past social history  and problem list.  Visual acuity was not assessed per patient preference since she has regular follow up with her ophthalmologist. Hearing and body mass index were assessed and reviewed.   During the course of the visit the patient was educated and counseled about appropriate screening and preventive services including : fall prevention , diabetes  screening, nutrition counseling, colorectal cancer screening, and recommended immunizations.    CC: The primary encounter diagnosis was Breast cancer screening. A diagnosis of Visit for preventive health examination was also pertinent to this visit.  Felt like flu last week,  Fatigue ,  Low grade fever. Taking z pack.  Mild cough .    Flu was negative    History Lexxy has a past medical history of Abnormal cervical Papanicolaou smear (1995) and Ovarian cyst (1993).   She has no past surgical history on file.   Her family history includes Breast cancer (age of onset: 71) in her cousin; Breast cancer (age of onset: 51) in her maternal aunt; Cancer (age of onset: 72) in her maternal aunt; Clotting disorder in her father; Diabetes in her mother; Heart disease in her mother; Hyperlipidemia in her mother; Multiple sclerosis in her paternal grandmother; Parkinson's disease in her brother; Stroke in her father.She reports that she has never smoked. She has never used smokeless tobacco. She reports that she drinks about 2.4 oz of alcohol per week . She reports that she does not use drugs.  Outpatient Medications Prior to Visit  Medication Sig Dispense Refill  . azithromycin (ZITHROMAX Z-PAK) 250 MG tablet 2 pills today then 1 pill a day for 4 days 6 each 0  . Cholecalciferol (VITAMIN D3) 1000 UNITS CAPS Take 1 capsule by mouth daily.    Marland Kitchen gabapentin (NEURONTIN) 100 MG capsule Take 1 capsule (100 mg total) by mouth 3 (three) times daily. (Patient  not taking: Reported on 01/25/2017) 90 capsule 3  . metroNIDAZOLE (FLAGYL) 500 MG tablet Take 1 tablet (500 mg total) by mouth 3 (three) times daily. (Patient not taking: Reported on 01/25/2017) 21 tablet 0   No facility-administered medications prior to visit.     Review of Systems   Patient denies headache, fevers, malaise, unintentional weight loss, skin rash, eye pain, sinus congestion and sinus pain, sore throat, dysphagia,  hemoptysis , cough, dyspnea,  wheezing, chest pain, palpitations, orthopnea, edema, abdominal pain, nausea, melena, diarrhea, constipation, flank pain, dysuria, hematuria, urinary  Frequency, nocturia, numbness, tingling, seizures,  Focal weakness, Loss of consciousness,  Tremor, insomnia, depression, anxiety, and suicidal ideation.      Objective:  BP 112/80   Pulse 72   Temp 98.1 F (36.7 C) (Oral)   Resp 16   Ht 5\' 8"  (1.727 m)   Wt 177 lb (80.3 kg)   LMP 06/22/2013   SpO2 98%   BMI 26.91 kg/m   Physical Exam  General appearance: alert, cooperative and appears stated age Head: Normocephalic, without obvious abnormality, atraumatic Eyes: conjunctivae/corneas clear. PERRL, EOM's intact. Fundi benign. Ears: normal TM's and external ear canals both ears Nose: Nares normal. Septum midline. Mucosa normal. No drainage or sinus tenderness. Throat: lips, mucosa, and tongue normal; teeth and gums normal Neck: no adenopathy, no carotid bruit, no JVD, supple, symmetrical, trachea midline and thyroid not enlarged, symmetric, no tenderness/mass/nodules Lungs: clear to auscultation bilaterally Breasts: normal appearance, no masses or tenderness Heart: regular rate and rhythm, S1, S2 normal, no murmur, click, rub or gallop Abdomen: soft, non-tender; bowel sounds normal; no masses,  no organomegaly Extremities: extremities normal, atraumatic, no cyanosis or edema Pulses: 2+ and symmetric Skin: Skin color, texture, turgor normal. No rashes or lesions Neurologic: Alert and oriented X 3, normal strength and tone. Normal symmetric reflexes. Normal coordination and gait.     Assessment & Plan:   Problem List Items Addressed This Visit    Visit for preventive health examination    Annual comprehensive preventive exam was done as well as an evaluation and management of chronic conditions .  During the course of the visit the patient was educated and counseled about appropriate screening and preventive services including :   diabetes screening, lipid analysis with projected  10 year  risk for CAD , nutrition counseling, breast, cervical and colorectal cancer screening, and recommended immunizations.  Printed recommendations for health maintenance screenings was given       Other Visit Diagnoses    Breast cancer screening    -  Primary   Relevant Orders   MM DIGITAL SCREENING BILATERAL      I have discontinued Ms. Schaner's metroNIDAZOLE. I am also having her start on oseltamivir. Additionally, I am having her maintain her Vitamin D3, gabapentin, and azithromycin.  Meds ordered this encounter  Medications  . oseltamivir (TAMIFLU) 75 MG capsule    Sig: Take 1 capsule (75 mg total) by mouth daily.    Dispense:  10 capsule    Refill:  0    Medications Discontinued During This Encounter  Medication Reason  . metroNIDAZOLE (FLAGYL) 500 MG tablet Completed Course    Follow-up: No Follow-up on file.   Crecencio Mc, MD

## 2017-01-25 NOTE — Progress Notes (Signed)
Pre visit review using our clinic review tool, if applicable. No additional management support is needed unless otherwise documented below in the visit note. 

## 2017-01-25 NOTE — Patient Instructions (Addendum)
Taking an antibiotic can create an imbalance in the normal population of bacteria that live in the small intestine.  This imbalance can persist for 3 months.   Taking a probiotic ( Align, Floraque or Culturelle), the generic version of one of these over the counter medications, or an alternative form (kombucha,  Yogurt, or another dietary source) for a minimum of 3 weeks may help prevent a serious antibiotic associated diarrhea  Called clostridium dificile colitis that occurs when the bacteria population is altered .  Taking a probiotic may also prevent vaginitis due to yeast infections and can be continued indefinitely if you feel that it improves your digestion or your elimination (bowels).   For liquid versions  Try Kombucha  Or Kevita.  available at [place] in the vegetable refrigerated section    You are due for 5 year follow up colonoscopy this year!!     Health Maintenance for Postmenopausal Women Introduction Menopause is a normal process in which your reproductive ability comes to an end. This process happens gradually over a span of months to years, usually between the ages of 36 and 5. Menopause is complete when you have missed 12 consecutive menstrual periods. It is important to talk with your health care provider about some of the most common conditions that affect postmenopausal women, such as heart disease, cancer, and bone loss (osteoporosis). Adopting a healthy lifestyle and getting preventive care can help to promote your health and wellness. Those actions can also lower your chances of developing some of these common conditions. What should I know about menopause? During menopause, you may experience a number of symptoms, such as:  Moderate-to-severe hot flashes.  Night sweats.  Decrease in sex drive.  Mood swings.  Headaches.  Tiredness.  Irritability.  Memory problems.  Insomnia. Choosing to treat or not to treat menopausal changes is an individual decision that  you make with your health care provider. What should I know about hormone replacement therapy and supplements? Hormone therapy products are effective for treating symptoms that are associated with menopause, such as hot flashes and night sweats. Hormone replacement carries certain risks, especially as you become older. If you are thinking about using estrogen or estrogen with progestin treatments, discuss the benefits and risks with your health care provider. What should I know about heart disease and stroke? Heart disease, heart attack, and stroke become more likely as you age. This may be due, in part, to the hormonal changes that your body experiences during menopause. These can affect how your body processes dietary fats, triglycerides, and cholesterol. Heart attack and stroke are both medical emergencies. There are many things that you can do to help prevent heart disease and stroke:  Have your blood pressure checked at least every 1-2 years. High blood pressure causes heart disease and increases the risk of stroke.  If you are 78-4 years old, ask your health care provider if you should take aspirin to prevent a heart attack or a stroke.  Do not use any tobacco products, including cigarettes, chewing tobacco, or electronic cigarettes. If you need help quitting, ask your health care provider.  It is important to eat a healthy diet and maintain a healthy weight.  Be sure to include plenty of vegetables, fruits, low-fat dairy products, and lean protein.  Avoid eating foods that are high in solid fats, added sugars, or salt (sodium).  Get regular exercise. This is one of the most important things that you can do for your health.  Try to exercise for at least 150 minutes each week. The type of exercise that you do should increase your heart rate and make you sweat. This is known as moderate-intensity exercise.  Try to do strengthening exercises at least twice each week. Do these in addition  to the moderate-intensity exercise.  Know your numbers.Ask your health care provider to check your cholesterol and your blood glucose. Continue to have your blood tested as directed by your health care provider. What should I know about cancer screening? There are several types of cancer. Take the following steps to reduce your risk and to catch any cancer development as early as possible. Breast Cancer  Practice breast self-awareness.  This means understanding how your breasts normally appear and feel.  It also means doing regular breast self-exams. Let your health care provider know about any changes, no matter how small.  If you are 1 or older, have a clinician do a breast exam (clinical breast exam or CBE) every year. Depending on your age, family history, and medical history, it may be recommended that you also have a yearly breast X-ray (mammogram).  If you have a family history of breast cancer, talk with your health care provider about genetic screening.  If you are at high risk for breast cancer, talk with your health care provider about having an MRI and a mammogram every year.  Breast cancer (BRCA) gene test is recommended for women who have family members with BRCA-related cancers. Results of the assessment will determine the need for genetic counseling and BRCA1 and for BRCA2 testing. BRCA-related cancers include these types:  Breast. This occurs in males or females.  Ovarian.  Tubal. This may also be called fallopian tube cancer.  Cancer of the abdominal or pelvic lining (peritoneal cancer).  Prostate.  Pancreatic. Cervical, Uterine, and Ovarian Cancer  Your health care provider may recommend that you be screened regularly for cancer of the pelvic organs. These include your ovaries, uterus, and vagina. This screening involves a pelvic exam, which includes checking for microscopic changes to the surface of your cervix (Pap test).  For women ages 21-65, health care  providers may recommend a pelvic exam and a Pap test every three years. For women ages 72-65, they may recommend the Pap test and pelvic exam, combined with testing for human papilloma virus (HPV), every five years. Some types of HPV increase your risk of cervical cancer. Testing for HPV may also be done on women of any age who have unclear Pap test results.  Other health care providers may not recommend any screening for nonpregnant women who are considered low risk for pelvic cancer and have no symptoms. Ask your health care provider if a screening pelvic exam is right for you.  If you have had past treatment for cervical cancer or a condition that could lead to cancer, you need Pap tests and screening for cancer for at least 20 years after your treatment. If Pap tests have been discontinued for you, your risk factors (such as having a new sexual partner) need to be reassessed to determine if you should start having screenings again. Some women have medical problems that increase the chance of getting cervical cancer. In these cases, your health care provider may recommend that you have screening and Pap tests more often.  If you have a family history of uterine cancer or ovarian cancer, talk with your health care provider about genetic screening.  If you have vaginal bleeding after reaching menopause, tell  your health care provider.  There are currently no reliable tests available to screen for ovarian cancer. Lung Cancer  Lung cancer screening is recommended for adults 1-31 years old who are at high risk for lung cancer because of a history of smoking. A yearly low-dose CT scan of the lungs is recommended if you:  Currently smoke.  Have a history of at least 30 pack-years of smoking and you currently smoke or have quit within the past 15 years. A pack-year is smoking an average of one pack of cigarettes per day for one year. Yearly screening should:  Continue until it has been 15 years since  you quit.  Stop if you develop a health problem that would prevent you from having lung cancer treatment. Colorectal Cancer  This type of cancer can be detected and can often be prevented.  Routine colorectal cancer screening usually begins at age 5 and continues through age 85.  If you have risk factors for colon cancer, your health care provider may recommend that you be screened at an earlier age.  If you have a family history of colorectal cancer, talk with your health care provider about genetic screening.  Your health care provider may also recommend using home test kits to check for hidden blood in your stool.  A small camera at the end of a tube can be used to examine your colon directly (sigmoidoscopy or colonoscopy). This is done to check for the earliest forms of colorectal cancer.  Direct examination of the colon should be repeated every 5-10 years until age 4. However, if early forms of precancerous polyps or small growths are found or if you have a family history or genetic risk for colorectal cancer, you may need to be screened more often. Skin Cancer  Check your skin from head to toe regularly.  Monitor any moles. Be sure to tell your health care provider:  About any new moles or changes in moles, especially if there is a change in a mole's shape or color.  If you have a mole that is larger than the size of a pencil eraser.  If any of your family members has a history of skin cancer, especially at a young age, talk with your health care provider about genetic screening.  Always use sunscreen. Apply sunscreen liberally and repeatedly throughout the day.  Whenever you are outside, protect yourself by wearing long sleeves, pants, a wide-brimmed hat, and sunglasses. What should I know about osteoporosis? Osteoporosis is a condition in which bone destruction happens more quickly than new bone creation. After menopause, you may be at an increased risk for osteoporosis.  To help prevent osteoporosis or the bone fractures that can happen because of osteoporosis, the following is recommended:  If you are 20-42 years old, get at least 1,000 mg of calcium and at least 600 mg of vitamin D per day.  If you are older than age 71 but younger than age 12, get at least 1,200 mg of calcium and at least 600 mg of vitamin D per day.  If you are older than age 97, get at least 1,200 mg of calcium and at least 800 mg of vitamin D per day. Smoking and excessive alcohol intake increase the risk of osteoporosis. Eat foods that are rich in calcium and vitamin D, and do weight-bearing exercises several times each week as directed by your health care provider. What should I know about how menopause affects my mental health? Depression may occur at any  age, but it is more common as you become older. Common symptoms of depression include:  Low or sad mood.  Changes in sleep patterns.  Changes in appetite or eating patterns.  Feeling an overall lack of motivation or enjoyment of activities that you previously enjoyed.  Frequent crying spells. Talk with your health care provider if you think that you are experiencing depression. What should I know about immunizations? It is important that you get and maintain your immunizations. These include:  Tetanus, diphtheria, and pertussis (Tdap) booster vaccine.  Influenza every year before the flu season begins.  Pneumonia vaccine.  Shingles vaccine. Your health care provider may also recommend other immunizations. This information is not intended to replace advice given to you by your health care provider. Make sure you discuss any questions you have with your health care provider. Document Released: 02/02/2006 Document Revised: 06/30/2016 Document Reviewed: 09/14/2015  2017 Elsevier

## 2017-01-27 NOTE — Assessment & Plan Note (Signed)
Annual comprehensive preventive exam was done as well as an evaluation and management of chronic conditions .  During the course of the visit the patient was educated and counseled about appropriate screening and preventive services including :  diabetes screening, lipid analysis with projected  10 year  risk for CAD , nutrition counseling, breast, cervical and colorectal cancer screening, and recommended immunizations.  Printed recommendations for health maintenance screenings was given 

## 2017-02-05 ENCOUNTER — Other Ambulatory Visit: Payer: Self-pay

## 2017-02-05 DIAGNOSIS — Z299 Encounter for prophylactic measures, unspecified: Secondary | ICD-10-CM

## 2017-02-05 NOTE — Progress Notes (Signed)
Patient came in to have blood drawn for testing per Dr. Tullo's orders. 

## 2017-02-06 ENCOUNTER — Encounter: Payer: Self-pay | Admitting: Internal Medicine

## 2017-02-06 LAB — CMP12+LP+TP+TSH+6AC+CBC/D/PLT
ALK PHOS: 109 IU/L (ref 39–117)
ALT: 12 IU/L (ref 0–32)
AST: 13 IU/L (ref 0–40)
Albumin/Globulin Ratio: 1.5 (ref 1.2–2.2)
Albumin: 4 g/dL (ref 3.5–5.5)
BASOS: 1 %
BUN/Creatinine Ratio: 17 (ref 9–23)
BUN: 12 mg/dL (ref 6–24)
Basophils Absolute: 0 10*3/uL (ref 0.0–0.2)
Bilirubin Total: 0.4 mg/dL (ref 0.0–1.2)
CALCIUM: 9.3 mg/dL (ref 8.7–10.2)
CHOL/HDL RATIO: 3.1 ratio (ref 0.0–4.4)
CREATININE: 0.72 mg/dL (ref 0.57–1.00)
Chloride: 105 mmol/L (ref 96–106)
Cholesterol, Total: 195 mg/dL (ref 100–199)
EOS (ABSOLUTE): 0.1 10*3/uL (ref 0.0–0.4)
Eos: 2 %
Estimated CHD Risk: <0.5 times avg. (ref 0.0–1.0)
Free Thyroxine Index: 1.6 (ref 1.2–4.9)
GFR calc Af Amer: 108 mL/min/{1.73_m2} (ref >59)
GFR, EST NON AFRICAN AMERICAN: 94 mL/min/{1.73_m2} (ref >59)
GGT: 13 IU/L (ref 0–60)
Globulin, Total: 2.6 g/dL (ref 1.5–4.5)
Glucose: 93 mg/dL (ref 65–99)
HDL: 63 mg/dL (ref >39)
Hematocrit: 39 % (ref 34.0–46.6)
Hemoglobin: 13.2 g/dL (ref 11.1–15.9)
IMMATURE GRANS (ABS): 0 10*3/uL (ref 0.0–0.1)
Immature Granulocytes: 0 %
Iron: 118 ug/dL (ref 27–159)
LDH: 164 IU/L (ref 119–226)
LDL Calculated: 120 mg/dL — ABNORMAL HIGH (ref 0–99)
Lymphocytes Absolute: 1.4 10*3/uL (ref 0.7–3.1)
Lymphs: 36 %
MCH: 30.5 pg (ref 26.6–33.0)
MCHC: 33.8 g/dL (ref 31.5–35.7)
MCV: 90 fL (ref 79–97)
MONOS ABS: 0.2 10*3/uL (ref 0.1–0.9)
Monocytes: 5 %
Neutrophils Absolute: 2.2 10*3/uL (ref 1.4–7.0)
Neutrophils: 56 %
PHOSPHORUS: 3.1 mg/dL (ref 2.5–4.5)
Platelets: 327 10*3/uL (ref 150–379)
Potassium: 4.3 mmol/L (ref 3.5–5.2)
RBC: 4.33 x10E6/uL (ref 3.77–5.28)
RDW: 13.4 % (ref 12.3–15.4)
SODIUM: 143 mmol/L (ref 134–144)
T3 Uptake Ratio: 22 % — ABNORMAL LOW (ref 24–39)
T4 TOTAL: 7.4 ug/dL (ref 4.5–12.0)
TSH: 1.32 u[IU]/mL (ref 0.450–4.500)
Total Protein: 6.6 g/dL (ref 6.0–8.5)
Triglycerides: 60 mg/dL (ref 0–149)
URIC ACID: 4.6 mg/dL (ref 2.5–7.1)
VLDL Cholesterol Cal: 12 mg/dL (ref 5–40)
WBC: 3.9 10*3/uL (ref 3.4–10.8)

## 2017-02-06 LAB — CBC AND DIFFERENTIAL
HEMATOCRIT: 39 % (ref 36–46)
HEMOGLOBIN: 13.2 g/dL (ref 12.0–16.0)
NEUTROS ABS: 2 /uL
Platelets: 327 10*3/uL (ref 150–399)
WBC: 4.3 10^3/mL

## 2017-02-06 LAB — BASIC METABOLIC PANEL
BUN: 12 mg/dL (ref 4–21)
CREATININE: 0.7 mg/dL (ref <=1.1)
Glucose: 93 mg/dL
Potassium: 4.3 mmol/L (ref 3.4–5.3)
SODIUM: 143 mmol/L (ref 137–147)

## 2017-02-06 LAB — LIPID PANEL
CHOLESTEROL: 195 mg/dL (ref 0–200)
HDL: 63 mg/dL (ref 35–70)
LDL Cholesterol: 120 mg/dL
LDl/HDL Ratio: 3.1
TRIGLYCERIDES: 60 mg/dL (ref 40–160)

## 2017-02-06 LAB — TSH: TSH: 1.32 u[IU]/mL (ref 0.41–5.90)

## 2017-02-06 LAB — HEPATIC FUNCTION PANEL
ALK PHOS: 109 U/L (ref 25–125)
ALT: 12 U/L (ref 7–35)
AST: 13 U/L (ref 13–35)
BILIRUBIN, TOTAL: 104 mg/dL

## 2017-02-06 LAB — VITAMIN D 25 HYDROXY (VIT D DEFICIENCY, FRACTURES)
VIT D 25 HYDROXY: 25.8
VIT D 25 HYDROXY: 25.8 ng/mL — AB (ref 30.0–100.0)

## 2017-02-14 ENCOUNTER — Telehealth: Payer: Self-pay | Admitting: Internal Medicine

## 2017-03-28 ENCOUNTER — Ambulatory Visit
Admission: RE | Admit: 2017-03-28 | Discharge: 2017-03-28 | Disposition: A | Discharge status: Home / Self Care | Payer: Managed Care, Other (non HMO) | Source: Ambulatory Visit | Attending: Internal Medicine | Admitting: Internal Medicine

## 2017-03-28 DIAGNOSIS — Z1231 Encounter for screening mammogram for malignant neoplasm of breast: Secondary | ICD-10-CM | POA: Insufficient documentation

## 2017-03-28 DIAGNOSIS — R928 Other abnormal and inconclusive findings on diagnostic imaging of breast: Secondary | ICD-10-CM | POA: Insufficient documentation

## 2017-03-28 DIAGNOSIS — Z1239 Encounter for other screening for malignant neoplasm of breast: Secondary | ICD-10-CM

## 2017-03-30 ENCOUNTER — Other Ambulatory Visit: Payer: Self-pay | Admitting: Internal Medicine

## 2017-03-30 DIAGNOSIS — N6489 Other specified disorders of breast: Secondary | ICD-10-CM

## 2017-03-30 DIAGNOSIS — R928 Other abnormal and inconclusive findings on diagnostic imaging of breast: Secondary | ICD-10-CM

## 2017-04-06 ENCOUNTER — Ambulatory Visit
Admission: RE | Admit: 2017-04-06 | Discharge: 2017-04-06 | Disposition: A | Discharge status: Home / Self Care | Payer: Managed Care, Other (non HMO) | Source: Ambulatory Visit | Attending: Internal Medicine | Admitting: Internal Medicine

## 2017-04-06 DIAGNOSIS — N6489 Other specified disorders of breast: Secondary | ICD-10-CM | POA: Insufficient documentation

## 2017-04-06 DIAGNOSIS — R928 Other abnormal and inconclusive findings on diagnostic imaging of breast: Secondary | ICD-10-CM | POA: Diagnosis present

## 2017-09-19 NOTE — Telephone Encounter (Signed)
Error

## 2018-03-07 ENCOUNTER — Encounter: Payer: Self-pay | Admitting: Internal Medicine

## 2018-03-07 ENCOUNTER — Ambulatory Visit (INDEPENDENT_AMBULATORY_CARE_PROVIDER_SITE_OTHER): Payer: Managed Care, Other (non HMO) | Admitting: Internal Medicine

## 2018-03-07 ENCOUNTER — Other Ambulatory Visit: Payer: Self-pay | Admitting: Internal Medicine

## 2018-03-07 VITALS — BP 102/66 | HR 81 | Temp 98.1°F | Resp 15 | Ht 68.0 in | Wt 179.6 lb

## 2018-03-07 DIAGNOSIS — Z1239 Encounter for other screening for malignant neoplasm of breast: Secondary | ICD-10-CM

## 2018-03-07 DIAGNOSIS — Z124 Encounter for screening for malignant neoplasm of cervix: Secondary | ICD-10-CM

## 2018-03-07 DIAGNOSIS — N941 Unspecified dyspareunia: Secondary | ICD-10-CM | POA: Diagnosis not present

## 2018-03-07 DIAGNOSIS — Z1231 Encounter for screening mammogram for malignant neoplasm of breast: Secondary | ICD-10-CM | POA: Diagnosis not present

## 2018-03-07 DIAGNOSIS — D126 Benign neoplasm of colon, unspecified: Secondary | ICD-10-CM

## 2018-03-07 DIAGNOSIS — Z0000 Encounter for general adult medical examination without abnormal findings: Secondary | ICD-10-CM | POA: Diagnosis not present

## 2018-03-07 MED ORDER — ZOSTER VAC RECOMB ADJUVANTED 50 MCG/0.5ML IM SUSR
0.5000 mL | Freq: Once | INTRAMUSCULAR | 1 refills | Status: AC
Start: 2018-03-07 — End: 2018-03-07

## 2018-03-07 MED ORDER — ESTROGENS, CONJUGATED 0.625 MG/GM VA CREA: 1.0000 | TOPICAL_CREAM | Freq: Every day | VAGINAL | 12 refills | Status: DC

## 2018-03-07 NOTE — Patient Instructions (Addendum)
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  This is precancerous and should be monitored every 5 years  If you decide you want to have the Cologuard test performed instead of deferring any colon cancer screening, check with your insurance to see if they will cover it   The ShingRx vaccine is now available in local pharmacies and is much more protective thant Zostavaxs,  It is therefore ADVISED for all interested adults over 50 to prevent shingles    If the premarin cream makes you too moist,  We can try  the tablet form.    Health Maintenance for Postmenopausal Women Menopause is a normal process in which your reproductive ability comes to an end. This process happens gradually over a span of months to years, usually between the ages of 9 and 48. Menopause is complete when you have missed 12 consecutive menstrual periods. It is important to talk with your health care provider about some of the most common conditions that affect postmenopausal women, such as heart disease, cancer, and bone loss (osteoporosis). Adopting a healthy lifestyle and getting preventive care can help to promote your health and wellness. Those actions can also lower your chances of developing some of these common conditions. What should I know about menopause? During menopause, you may experience a number of symptoms, such as:  Moderate-to-severe hot flashes.  Night sweats.  Decrease in sex drive.  Mood swings.  Headaches.  Tiredness.  Irritability.  Memory problems.  Insomnia.  Choosing to treat or not to treat menopausal changes is an individual decision that you make with your health care provider. What should I know about hormone replacement therapy and supplements? Hormone therapy products are effective for treating symptoms that are associated with menopause, such as hot flashes and night sweats. Hormone replacement carries certain risks, especially as you become older. If you  are thinking about using estrogen or estrogen with progestin treatments, discuss the benefits and risks with your health care provider. What should I know about heart disease and stroke? Heart disease, heart attack, and stroke become more likely as you age. This may be due, in part, to the hormonal changes that your body experiences during menopause. These can affect how your body processes dietary fats, triglycerides, and cholesterol. Heart attack and stroke are both medical emergencies. There are many things that you can do to help prevent heart disease and stroke:  Have your blood pressure checked at least every 1-2 years. High blood pressure causes heart disease and increases the risk of stroke.  If you are 42-33 years old, ask your health care provider if you should take aspirin to prevent a heart attack or a stroke.  Do not use any tobacco products, including cigarettes, chewing tobacco, or electronic cigarettes. If you need help quitting, ask your health care provider.  It is important to eat a healthy diet and maintain a healthy weight. ? Be sure to include plenty of vegetables, fruits, low-fat dairy products, and lean protein. ? Avoid eating foods that are high in solid fats, added sugars, or salt (sodium).  Get regular exercise. This is one of the most important things that you can do for your health. ? Try to exercise for at least 150 minutes each week. The type of exercise that you do should increase your heart rate and make you sweat. This is known as moderate-intensity exercise. ? Try to do strengthening exercises at least twice each week. Do these in addition to  the moderate-intensity exercise.  Know your numbers.Ask your health care provider to check your cholesterol and your blood glucose. Continue to have your blood tested as directed by your health care provider.  What should I know about cancer screening? There are several types of cancer. Take the following steps to reduce  your risk and to catch any cancer development as early as possible. Breast Cancer  Practice breast self-awareness. ? This means understanding how your breasts normally appear and feel. ? It also means doing regular breast self-exams. Let your health care provider know about any changes, no matter how small.  If you are 63 or older, have a clinician do a breast exam (clinical breast exam or CBE) every year. Depending on your age, family history, and medical history, it may be recommended that you also have a yearly breast X-ray (mammogram).  If you have a family history of breast cancer, talk with your health care provider about genetic screening.  If you are at high risk for breast cancer, talk with your health care provider about having an MRI and a mammogram every year.  Breast cancer (BRCA) gene test is recommended for women who have family members with BRCA-related cancers. Results of the assessment will determine the need for genetic counseling and BRCA1 and for BRCA2 testing. BRCA-related cancers include these types: ? Breast. This occurs in males or females. ? Ovarian. ? Tubal. This may also be called fallopian tube cancer. ? Cancer of the abdominal or pelvic lining (peritoneal cancer). ? Prostate. ? Pancreatic.  Cervical, Uterine, and Ovarian Cancer Your health care provider may recommend that you be screened regularly for cancer of the pelvic organs. These include your ovaries, uterus, and vagina. This screening involves a pelvic exam, which includes checking for microscopic changes to the surface of your cervix (Pap test).  For women ages 21-65, health care providers may recommend a pelvic exam and a Pap test every three years. For women ages 83-65, they may recommend the Pap test and pelvic exam, combined with testing for human papilloma virus (HPV), every five years. Some types of HPV increase your risk of cervical cancer. Testing for HPV may also be done on women of any age who  have unclear Pap test results.  Other health care providers may not recommend any screening for nonpregnant women who are considered low risk for pelvic cancer and have no symptoms. Ask your health care provider if a screening pelvic exam is right for you.  If you have had past treatment for cervical cancer or a condition that could lead to cancer, you need Pap tests and screening for cancer for at least 20 years after your treatment. If Pap tests have been discontinued for you, your risk factors (such as having a new sexual partner) need to be reassessed to determine if you should start having screenings again. Some women have medical problems that increase the chance of getting cervical cancer. In these cases, your health care provider may recommend that you have screening and Pap tests more often.  If you have a family history of uterine cancer or ovarian cancer, talk with your health care provider about genetic screening.  If you have vaginal bleeding after reaching menopause, tell your health care provider.  There are currently no reliable tests available to screen for ovarian cancer.  Lung Cancer Lung cancer screening is recommended for adults 56-37 years old who are at high risk for lung cancer because of a history of smoking. A yearly low-dose  CT scan of the lungs is recommended if you:  Currently smoke.  Have a history of at least 30 pack-years of smoking and you currently smoke or have quit within the past 15 years. A pack-year is smoking an average of one pack of cigarettes per day for one year.  Yearly screening should:  Continue until it has been 15 years since you quit.  Stop if you develop a health problem that would prevent you from having lung cancer treatment.  Colorectal Cancer  This type of cancer can be detected and can often be prevented.  Routine colorectal cancer screening usually begins at age 63 and continues through age 30.  If you have risk factors for colon  cancer, your health care provider may recommend that you be screened at an earlier age.  If you have a family history of colorectal cancer, talk with your health care provider about genetic screening.  Your health care provider may also recommend using home test kits to check for hidden blood in your stool.  A small camera at the end of a tube can be used to examine your colon directly (sigmoidoscopy or colonoscopy). This is done to check for the earliest forms of colorectal cancer.  Direct examination of the colon should be repeated every 5-10 years until age 44. However, if early forms of precancerous polyps or small growths are found or if you have a family history or genetic risk for colorectal cancer, you may need to be screened more often.  Skin Cancer  Check your skin from head to toe regularly.  Monitor any moles. Be sure to tell your health care provider: ? About any new moles or changes in moles, especially if there is a change in a mole's shape or color. ? If you have a mole that is larger than the size of a pencil eraser.  If any of your family members has a history of skin cancer, especially at a young age, talk with your health care provider about genetic screening.  Always use sunscreen. Apply sunscreen liberally and repeatedly throughout the day.  Whenever you are outside, protect yourself by wearing long sleeves, pants, a wide-brimmed hat, and sunglasses.  What should I know about osteoporosis? Osteoporosis is a condition in which bone destruction happens more quickly than new bone creation. After menopause, you may be at an increased risk for osteoporosis. To help prevent osteoporosis or the bone fractures that can happen because of osteoporosis, the following is recommended:  If you are 16-33 years old, get at least 1,000 mg of calcium and at least 600 mg of vitamin D per day.  If you are older than age 75 but younger than age 36, get at least 1,200 mg of calcium and  at least 600 mg of vitamin D per day.  If you are older than age 57, get at least 1,200 mg of calcium and at least 800 mg of vitamin D per day.  Smoking and excessive alcohol intake increase the risk of osteoporosis. Eat foods that are rich in calcium and vitamin D, and do weight-bearing exercises several times each week as directed by your health care provider. What should I know about how menopause affects my mental health? Depression may occur at any age, but it is more common as you become older. Common symptoms of depression include:  Low or sad mood.  Changes in sleep patterns.  Changes in appetite or eating patterns.  Feeling an overall lack of motivation or enjoyment of  activities that you previously enjoyed.  Frequent crying spells.  Talk with your health care provider if you think that you are experiencing depression. What should I know about immunizations? It is important that you get and maintain your immunizations. These include:  Tetanus, diphtheria, and pertussis (Tdap) booster vaccine.  Influenza every year before the flu season begins.  Pneumonia vaccine.  Shingles vaccine.  Your health care provider may also recommend other immunizations. This information is not intended to replace advice given to you by your health care provider. Make sure you discuss any questions you have with your health care provider. Document Released: 02/02/2006 Document Revised: 06/30/2016 Document Reviewed: 09/14/2015 [employer] Interactive Patient Education  2018 [employer].

## 2018-03-07 NOTE — Progress Notes (Signed)
Patient ID: Maria [name], female    DOB: [birth year]  Age: 57 y.o. MRN: [id number]  The patient is here for annual preventive examination and management of other chronic and acute problems.  Last PAP 2016 Colon 2013 5 yr follow up deferred by patient due to out of pocket cost (has a $3K deductible)    The risk factors are reflected in the social history.  The roster of all physicians providing medical care to patient - is listed in the Snapshot section of the chart.  Activities of daily living:  The patient is 100% independent in all ADLs: dressing, toileting, feeding as well as independent mobility  Home safety : The patient has smoke detectors in the home. They wear seatbelts.  There are no firearms at home. There is no violence in the home.   There is no risks for hepatitis, STDs or HIV. There is no   history of blood transfusion. They have no travel history to infectious disease endemic areas of the world.  The patient has seen their dentist in the last six month. They have seen their eye doctor in the last year. They admit to slight hearing difficulty with regard to whispered voices and some television programs.  They have deferred audiologic testing in the last year.  They do not  have excessive sun exposure. Discussed the need for sun protection: hats, long sleeves and use of sunscreen if there is significant sun exposure.   Diet: the importance of a healthy diet is discussed. They do have a healthy diet.  The benefits of regular aerobic exercise were discussed. She walks 4 times per week ,  20 minutes.   Depression screen: there are no signs or vegative symptoms of depression- irritability, change in appetite, anhedonia, sadness/tearfullness.  Cognitive assessment: the patient manages all their financial and personal affairs and is actively engaged. They could relate day,date,year and events; recalled 2/3 objects at 3 minutes; performed clock-face test normally.  The following  portions of the patient's history were reviewed and updated as appropriate: allergies, current medications, past family history, past medical history,  past surgical history, past social history  and problem list.  Visual acuity was not assessed per patient preference since she has regular follow up with her ophthalmologist. Hearing and body mass index were assessed and reviewed.   During the course of the visit the patient was educated and counseled about appropriate screening and preventive services including : fall prevention , diabetes screening, nutrition counseling, colorectal cancer screening, and recommended immunizations.    CC: The primary encounter diagnosis was Cervical cancer screening. Diagnoses of Encounter for preventive health examination, Breast cancer screening, Dyspareunia, female, Visit for preventive health examination, Screening for cervical cancer, and Tubular adenoma of colon were also pertinent to this visit.  1) discomfort during intercourse..  Denies vaginal dryness as the cause,  Feels pressure and pain at the cervical os with deep penetration. No bleeding  History Akemi has a past medical history of Abnormal cervical Papanicolaou smear (1995) and Ovarian cyst (1993).   She has no past surgical history on file.   Her family history includes Breast cancer (age of onset: 59) in her cousin; Breast cancer (age of onset: 15) in her maternal aunt; Cancer (age of onset: 76) in her maternal aunt; Clotting disorder in her father; Diabetes in her mother; Heart disease in her mother; Hyperlipidemia in her mother; Multiple sclerosis in her paternal grandmother; Parkinson's disease in her brother; Stroke in her father.She reports that  has never smoked. she has never used smokeless tobacco. She reports that she drinks about 2.4 oz of alcohol per week. She reports that she does not use drugs.  Outpatient Medications Prior to Visit  Medication Sig Dispense Refill  . Cholecalciferol  (VITAMIN D-3) 5000 units TABS Take 1 tablet by mouth 3 (three) times a week.    . gabapentin (NEURONTIN) 100 MG capsule Take 1 capsule (100 mg total) by mouth 3 (three) times daily. (Patient not taking: Reported on 01/25/2017) 90 capsule 3  . azithromycin (ZITHROMAX Z-PAK) 250 MG tablet 2 pills today then 1 pill a day for 4 days (Patient not taking: Reported on 03/07/2018) 6 each 0  . Cholecalciferol (VITAMIN D3) 1000 UNITS CAPS Take 1 capsule by mouth daily.    Marland Kitchen oseltamivir (TAMIFLU) 75 MG capsule Take 1 capsule (75 mg total) by mouth daily. (Patient not taking: Reported on 03/07/2018) 10 capsule 0   No facility-administered medications prior to visit.     Review of Systems   Patient denies headache, fevers, malaise, unintentional weight loss, skin rash, eye pain, sinus congestion and sinus pain, sore throat, dysphagia,  hemoptysis , cough, dyspnea, wheezing, chest pain, palpitations, orthopnea, edema, abdominal pain, nausea, melena, diarrhea, constipation, flank pain, dysuria, hematuria, urinary  Frequency, nocturia, numbness, tingling, seizures,  Focal weakness, Loss of consciousness,  Tremor, insomnia, depression, anxiety, and suicidal ideation.      Objective:  BP 102/66 (BP Location: Left Arm, Patient Position: Sitting, Cuff Size: Normal)   Pulse 81   Temp 98.1 F (36.7 C) (Oral)   Resp 15   Ht 5\' 8"  (1.727 m)   Wt 179 lb 9.6 oz (81.5 kg)   LMP 06/22/2013   SpO2 97%   BMI 27.31 kg/m   Physical Exam   General Appearance:    Alert, cooperative, no distress, appears stated age  Head:    Normocephalic, without obvious abnormality, atraumatic  Eyes:    PERRL, conjunctiva/corneas clear, EOM's intact, fundi    benign, both eyes  Ears:    Normal TM's and external ear canals, both ears  Nose:   Nares normal, septum midline, mucosa normal, no drainage    or sinus tenderness  Throat:   Lips, mucosa, and tongue normal; teeth and gums normal  Neck:   Supple, symmetrical, trachea midline,  no adenopathy;    thyroid:  no enlargement/tenderness/nodules; no carotid   bruit or JVD  Back:     Symmetric, no curvature, ROM normal, no CVA tenderness  Lungs:     Clear to auscultation bilaterally, respirations unlabored  Chest Wall:    No tenderness or deformity   Heart:    Regular rate and rhythm, S1 and S2 normal, no murmur, rub   or gallop  Breast Exam:    No tenderness, masses, or nipple abnormality  Abdomen:     Soft, non-tender, bowel sounds active all four quadrants,    no masses, no organomegaly  Genitalia:    Pelvic: cervix normal in appearance, external genitalia normal, no adnexal masses or tenderness, no cervical motion tenderness, rectovaginal septum normal, uterus normal size, shape, and consistency and vagina normal without discharge  Extremities:   Extremities normal, atraumatic, no cyanosis or edema  Pulses:   2+ and symmetric all extremities  Skin:   Skin color, texture, turgor normal, no rashes or lesions  Lymph nodes:   Cervical, supraclavicular, and axillary nodes normal  Neurologic:   CNII-XII intact, normal strength, sensation and reflexes    throughout  Assessment & Plan:   Problem List Items Addressed This Visit    Dyspareunia, female    Vault is narrow,  Cervix is nontender on exam..  She is post menopausal and appears to have adequate moisture.  Trial of premarin cream ,  vagifem if she does not tolerate the increased moisture      Screening for cervical cancer    PAP smear was done today       Tubular adenoma of colon    She has been deferring 5 year follow up due to out of pocket cost.  Discussed with her the decreased sensitivity of Cologuard when used in patient who are at above average risk , but I would rather her use Cologuard if a positive result will persuade her to have repeat colonoscopy      Visit for preventive health examination    Annual comprehensive preventive exam was done as well as an evaluation and management of chronic  conditions .  During the course of the visit the patient was educated and counseled about appropriate screening and preventive services including :  diabetes screening, lipid analysis with projected  10 year  risk for CAD , nutrition counseling, breast, cervical and colorectal cancer screening, and recommended immunizations.  Printed recommendations for health maintenance screenings was given       Other Visit Diagnoses    Cervical cancer screening    -  Primary   Relevant Orders   Cytology - PAP   Encounter for preventive health examination       Relevant Orders   Lipid panel   Iron, TIBC and Ferritin Panel   CBC with Differential/Platelet   TSH   Comprehensive metabolic panel   VITAMIN D 25 Hydroxy (Vit-D Deficiency, Fractures)   Hemoglobin A1c   Breast cancer screening       Relevant Orders   MM SCREENING BREAST TOMO BILATERAL      I have discontinued Delainy L. Estrin's Vitamin D3, azithromycin, and oseltamivir. I am also having her start on Zoster Vaccine Adjuvanted and conjugated estrogens. Additionally, I am having her maintain her gabapentin and Vitamin D-3.  Meds ordered this encounter  Medications  . Zoster Vaccine Adjuvanted [facility]) injection    Sig: Inject 0.5 mLs into the muscle once for 1 dose.    Dispense:  1 each    Refill:  1  . conjugated estrogens (PREMARIN) vaginal cream    Sig: Place 1 Applicatorful vaginally daily. For 2 weeks at bedtime,  Then twice weekly thereafter    Dispense:  42.5 g    Refill:  12    Medications Discontinued During This Encounter  Medication Reason  . azithromycin (ZITHROMAX Z-PAK) 250 MG tablet Completed Course  . oseltamivir (TAMIFLU) 75 MG capsule Prescription never filled  . Cholecalciferol (VITAMIN D3) 1000 UNITS CAPS Change in therapy    Follow-up: No Follow-up on file.   Crecencio Mc, MD

## 2018-03-09 DIAGNOSIS — N941 Unspecified dyspareunia: Secondary | ICD-10-CM | POA: Insufficient documentation

## 2018-03-09 NOTE — Assessment & Plan Note (Signed)
Vault is narrow,  Cervix is nontender on exam..  She is post menopausal and appears to have adequate moisture.  Trial of premarin cream ,  vagifem if she does not tolerate the increased moisture

## 2018-03-09 NOTE — Assessment & Plan Note (Signed)
She has been deferring 5 year follow up due to out of pocket cost.  Discussed with her the decreased sensitivity of Cologuard when used in patient who are at above average risk , but I would rather her use Cologuard if a positive result will persuade her to have repeat colonoscopy

## 2018-03-09 NOTE — Assessment & Plan Note (Signed)
Annual comprehensive preventive exam was done as well as an evaluation and management of chronic conditions .  During the course of the visit the patient was educated and counseled about appropriate screening and preventive services including :  diabetes screening, lipid analysis with projected  10 year  risk for CAD , nutrition counseling, breast, cervical and colorectal cancer screening, and recommended immunizations.  Printed recommendations for health maintenance screenings was given 

## 2018-03-09 NOTE — Assessment & Plan Note (Signed)
PAP smear was done today 

## 2018-03-13 LAB — PAP LB AND HPV HIGH-RISK
HPV, HIGH-RISK: NEGATIVE
PAP Smear Comment: 0

## 2018-03-25 ENCOUNTER — Encounter: Payer: Self-pay | Admitting: Internal Medicine

## 2018-03-26 ENCOUNTER — Telehealth: Payer: Self-pay | Admitting: Internal Medicine

## 2018-03-26 ENCOUNTER — Telehealth: Payer: Self-pay

## 2018-03-26 NOTE — Telephone Encounter (Signed)
Please advise 

## 2018-03-26 NOTE — Telephone Encounter (Signed)
Copied from [place] [id number]. Topic: General - Other >> Mar 26, 2018  2:06 PM Darl Householder, RMA wrote: Reason for CRM: Larene Beach from [name] is requesting a call back from Dr. Lupita Dawn CMA concerning labs, please return call to shannon at [phone]

## 2018-03-26 NOTE — Telephone Encounter (Deleted)
Pts. labs were put in incorrect chart. Contacted pts PCP to let them know of mistake and giving them lab results of correct pt.

## 2018-03-29 NOTE — Telephone Encounter (Signed)
Spoke with Maria [name] with IT and everything has been resolved.

## 2018-04-02 ENCOUNTER — Encounter: Payer: Self-pay | Admitting: Internal Medicine

## 2018-04-05 ENCOUNTER — Other Ambulatory Visit: Payer: Self-pay

## 2018-04-05 DIAGNOSIS — Z0000 Encounter for general adult medical examination without abnormal findings: Secondary | ICD-10-CM

## 2018-04-06 LAB — COMPREHENSIVE METABOLIC PANEL
A/G RATIO: 1.7 (ref 1.2–2.2)
ALT: 13 IU/L (ref 0–32)
AST: 17 IU/L (ref 0–40)
Albumin: 4.2 g/dL (ref 3.5–5.5)
Alkaline Phosphatase: 109 IU/L (ref 39–117)
BILIRUBIN TOTAL: 0.4 mg/dL (ref 0.0–1.2)
BUN/Creatinine Ratio: 13 (ref 9–23)
BUN: 12 mg/dL (ref 6–24)
CHLORIDE: 105 mmol/L (ref 96–106)
CO2: 24 mmol/L (ref 20–29)
Calcium: 9.4 mg/dL (ref 8.7–10.2)
Creatinine, Ser: 0.93 mg/dL (ref 0.57–1.00)
GFR calc non Af Amer: 68 mL/min/{1.73_m2} (ref >59)
GFR, EST AFRICAN AMERICAN: 79 mL/min/{1.73_m2} (ref >59)
GLOBULIN, TOTAL: 2.5 g/dL (ref 1.5–4.5)
Glucose: 89 mg/dL (ref 65–99)
POTASSIUM: 4.8 mmol/L (ref 3.5–5.2)
SODIUM: 143 mmol/L (ref 134–144)
TOTAL PROTEIN: 6.7 g/dL (ref 6.0–8.5)

## 2018-04-06 LAB — CBC WITH DIFFERENTIAL/PLATELET
BASOS ABS: 0 10*3/uL (ref 0.0–0.2)
Basos: 0 %
EOS (ABSOLUTE): 0.1 10*3/uL (ref 0.0–0.4)
Eos: 2 %
Hematocrit: 40.4 % (ref 34.0–46.6)
Hemoglobin: 13.8 g/dL (ref 11.1–15.9)
IMMATURE GRANS (ABS): 0 10*3/uL (ref 0.0–0.1)
IMMATURE GRANULOCYTES: 0 %
LYMPHS: 31 %
Lymphocytes Absolute: 1.5 10*3/uL (ref 0.7–3.1)
MCH: 30.3 pg (ref 26.6–33.0)
MCHC: 34.2 g/dL (ref 31.5–35.7)
MCV: 89 fL (ref 79–97)
Monocytes Absolute: 0.3 10*3/uL (ref 0.1–0.9)
Monocytes: 5 %
NEUTROS PCT: 62 %
Neutrophils Absolute: 2.9 10*3/uL (ref 1.4–7.0)
Platelets: 324 10*3/uL (ref 150–379)
RBC: 4.55 x10E6/uL (ref 3.77–5.28)
RDW: 13.5 % (ref 12.3–15.4)
WBC: 4.8 10*3/uL (ref 3.4–10.8)

## 2018-04-06 LAB — LIPID PANEL WITH LDL/HDL RATIO
Cholesterol, Total: 197 mg/dL (ref 100–199)
HDL: 66 mg/dL (ref >39)
LDL Calculated: 120 mg/dL — ABNORMAL HIGH (ref 0–99)
LDL/HDL RATIO: 1.8 ratio (ref 0.0–3.2)
Triglycerides: 53 mg/dL (ref 0–149)
VLDL Cholesterol Cal: 11 mg/dL (ref 5–40)

## 2018-04-06 LAB — HGB A1C W/O EAG: Hgb A1c MFr Bld: 5.5 % (ref 4.8–5.6)

## 2018-04-06 LAB — IRON,TIBC AND FERRITIN PANEL
Ferritin: 60 ng/mL (ref 15–150)
IRON SATURATION: 24 % (ref 15–55)
IRON: 73 ug/dL (ref 27–159)
TIBC: 307 ug/dL (ref 250–450)
UIBC: 234 ug/dL (ref 131–425)

## 2018-04-06 LAB — VITAMIN D 25 HYDROXY (VIT D DEFICIENCY, FRACTURES): Vit D, 25-Hydroxy: 29.3 ng/mL — ABNORMAL LOW (ref 30.0–100.0)

## 2018-04-06 LAB — TSH: TSH: 1.13 u[IU]/mL (ref 0.450–4.500)

## 2018-05-07 ENCOUNTER — Ambulatory Visit
Admission: RE | Admit: 2018-05-07 | Discharge: 2018-05-07 | Disposition: A | Discharge status: Home / Self Care | Payer: Managed Care, Other (non HMO) | Source: Ambulatory Visit | Attending: Internal Medicine | Admitting: Internal Medicine

## 2018-05-07 DIAGNOSIS — Z1231 Encounter for screening mammogram for malignant neoplasm of breast: Secondary | ICD-10-CM | POA: Insufficient documentation

## 2018-05-07 DIAGNOSIS — Z1239 Encounter for other screening for malignant neoplasm of breast: Secondary | ICD-10-CM

## 2019-03-21 ENCOUNTER — Encounter: Payer: Self-pay | Admitting: Internal Medicine

## 2019-03-21 ENCOUNTER — Ambulatory Visit (INDEPENDENT_AMBULATORY_CARE_PROVIDER_SITE_OTHER): Payer: Managed Care, Other (non HMO) | Admitting: Internal Medicine

## 2019-03-21 ENCOUNTER — Other Ambulatory Visit: Payer: Self-pay

## 2019-03-21 VITALS — BP 116/66 | HR 74 | Temp 97.5°F | Resp 14 | Ht 68.0 in | Wt 180.4 lb

## 2019-03-21 DIAGNOSIS — Z803 Family history of malignant neoplasm of breast: Secondary | ICD-10-CM

## 2019-03-21 DIAGNOSIS — Z8742 Personal history of other diseases of the female genital tract: Secondary | ICD-10-CM

## 2019-03-21 DIAGNOSIS — E782 Mixed hyperlipidemia: Secondary | ICD-10-CM | POA: Diagnosis not present

## 2019-03-21 DIAGNOSIS — Z1239 Encounter for other screening for malignant neoplasm of breast: Secondary | ICD-10-CM

## 2019-03-21 DIAGNOSIS — E559 Vitamin D deficiency, unspecified: Secondary | ICD-10-CM | POA: Diagnosis not present

## 2019-03-21 DIAGNOSIS — D126 Benign neoplasm of colon, unspecified: Secondary | ICD-10-CM

## 2019-03-21 DIAGNOSIS — Z0000 Encounter for general adult medical examination without abnormal findings: Secondary | ICD-10-CM

## 2019-03-21 LAB — LIPID PANEL
Cholesterol: 201 mg/dL — ABNORMAL HIGH (ref 0–200)
HDL: 64.1 mg/dL (ref >39.00)
LDL CALC: 115 mg/dL — AB (ref 0–99)
NONHDL: 137.07
Total CHOL/HDL Ratio: 3
Triglycerides: 108 mg/dL (ref 0.0–149.0)
VLDL: 21.6 mg/dL (ref 0.0–40.0)

## 2019-03-21 LAB — COMPREHENSIVE METABOLIC PANEL
ALT: 11 U/L (ref 0–35)
AST: 14 U/L (ref 0–37)
Albumin: 4.3 g/dL (ref 3.5–5.2)
Alkaline Phosphatase: 100 U/L (ref 39–117)
BUN: 13 mg/dL (ref 6–23)
CO2: 28 mEq/L (ref 19–32)
CREATININE: 0.73 mg/dL (ref 0.40–1.20)
Calcium: 9.6 mg/dL (ref 8.4–10.5)
Chloride: 102 mEq/L (ref 96–112)
GFR: 81.68 mL/min (ref >60.00)
Glucose, Bld: 95 mg/dL (ref 70–99)
Potassium: 3.8 mEq/L (ref 3.5–5.1)
SODIUM: 138 meq/L (ref 135–145)
Total Bilirubin: 0.4 mg/dL (ref 0.2–1.2)
Total Protein: 7.5 g/dL (ref 6.0–8.3)

## 2019-03-21 LAB — TSH: TSH: 1.19 u[IU]/mL (ref 0.35–4.50)

## 2019-03-21 LAB — VITAMIN D 25 HYDROXY (VIT D DEFICIENCY, FRACTURES): VITD: 32.48 ng/mL (ref 30.00–100.00)

## 2019-03-21 NOTE — Patient Instructions (Signed)
Your annual mammogram has been ordered.  You are encouraged (required) to call to make your appointment at [facility]  Genetics Counselling Referral made   Let me know when you are ready for colonoscopy referral   Health Maintenance for Postmenopausal Women Menopause is a normal process in which your reproductive ability comes to an end. This process happens gradually over a span of months to years, usually between the ages of 75 and 22. Menopause is complete when you have missed 12 consecutive menstrual periods. It is important to talk with your health care provider about some of the most common conditions that affect postmenopausal women, such as heart disease, cancer, and bone loss (osteoporosis). Adopting a healthy lifestyle and getting preventive care can help to promote your health and wellness. Those actions can also lower your chances of developing some of these common conditions. What should I know about menopause? During menopause, you may experience a number of symptoms, such as:  Moderate-to-severe hot flashes.  Night sweats.  Decrease in sex drive.  Mood swings.  Headaches.  Tiredness.  Irritability.  Memory problems.  Insomnia. Choosing to treat or not to treat menopausal changes is an individual decision that you make with your health care provider. What should I know about hormone replacement therapy and supplements? Hormone therapy products are effective for treating symptoms that are associated with menopause, such as hot flashes and night sweats. Hormone replacement carries certain risks, especially as you become older. If you are thinking about using estrogen or estrogen with progestin treatments, discuss the benefits and risks with your health care provider. What should I know about heart disease and stroke? Heart disease, heart attack, and stroke become more likely as you age. This may be due, in part, to the hormonal changes that your body  experiences during menopause. These can affect how your body processes dietary fats, triglycerides, and cholesterol. Heart attack and stroke are both medical emergencies. There are many things that you can do to help prevent heart disease and stroke:  Have your blood pressure checked at least every 1-2 years. High blood pressure causes heart disease and increases the risk of stroke.  If you are 83-74 years old, ask your health care provider if you should take aspirin to prevent a heart attack or a stroke.  Do not use any tobacco products, including cigarettes, chewing tobacco, or electronic cigarettes. If you need help quitting, ask your health care provider.  It is important to eat a healthy diet and maintain a healthy weight. ? Be sure to include plenty of vegetables, fruits, low-fat dairy products, and lean protein. ? Avoid eating foods that are high in solid fats, added sugars, or salt (sodium).  Get regular exercise. This is one of the most important things that you can do for your health. ? Try to exercise for at least 150 minutes each week. The type of exercise that you do should increase your heart rate and make you sweat. This is known as moderate-intensity exercise. ? Try to do strengthening exercises at least twice each week. Do these in addition to the moderate-intensity exercise.  Know your numbers.Ask your health care provider to check your cholesterol and your blood glucose. Continue to have your blood tested as directed by your health care provider.  What should I know about cancer screening? There are several types of cancer. Take the following steps to reduce your risk and to catch any cancer development as early as possible. Breast Cancer  Practice breast self-awareness. ? This means understanding how your breasts normally appear and feel. ? It also means doing regular breast self-exams. Let your health care provider know about any changes, no matter how small.  If you  are 89 or older, have a clinician do a breast exam (clinical breast exam or CBE) every year. Depending on your age, family history, and medical history, it may be recommended that you also have a yearly breast X-ray (mammogram).  If you have a family history of breast cancer, talk with your health care provider about genetic screening.  If you are at high risk for breast cancer, talk with your health care provider about having an MRI and a mammogram every year.  Breast cancer (BRCA) gene test is recommended for women who have family members with BRCA-related cancers. Results of the assessment will determine the need for genetic counseling and BRCA1 and for BRCA2 testing. BRCA-related cancers include these types: ? Breast. This occurs in males or females. ? Ovarian. ? Tubal. This may also be called fallopian tube cancer. ? Cancer of the abdominal or pelvic lining (peritoneal cancer). ? Prostate. ? Pancreatic. Cervical, Uterine, and Ovarian Cancer Your health care provider may recommend that you be screened regularly for cancer of the pelvic organs. These include your ovaries, uterus, and vagina. This screening involves a pelvic exam, which includes checking for microscopic changes to the surface of your cervix (Pap test).  For women ages 21-65, health care providers may recommend a pelvic exam and a Pap test every three years. For women ages 51-65, they may recommend the Pap test and pelvic exam, combined with testing for human papilloma virus (HPV), every five years. Some types of HPV increase your risk of cervical cancer. Testing for HPV may also be done on women of any age who have unclear Pap test results.  Other health care providers may not recommend any screening for nonpregnant women who are considered low risk for pelvic cancer and have no symptoms. Ask your health care provider if a screening pelvic exam is right for you.  If you have had past treatment for cervical cancer or a condition  that could lead to cancer, you need Pap tests and screening for cancer for at least 20 years after your treatment. If Pap tests have been discontinued for you, your risk factors (such as having a new sexual partner) need to be reassessed to determine if you should start having screenings again. Some women have medical problems that increase the chance of getting cervical cancer. In these cases, your health care provider may recommend that you have screening and Pap tests more often.  If you have a family history of uterine cancer or ovarian cancer, talk with your health care provider about genetic screening.  If you have vaginal bleeding after reaching menopause, tell your health care provider.  There are currently no reliable tests available to screen for ovarian cancer. Lung Cancer Lung cancer screening is recommended for adults 15-93 years old who are at high risk for lung cancer because of a history of smoking. A yearly low-dose CT scan of the lungs is recommended if you:  Currently smoke.  Have a history of at least 30 pack-years of smoking and you currently smoke or have quit within the past 15 years. A pack-year is smoking an average of one pack of cigarettes per day for one year. Yearly screening should:  Continue until it has been 15 years since you quit.  Stop if you  develop a health problem that would prevent you from having lung cancer treatment. Colorectal Cancer  This type of cancer can be detected and can often be prevented.  Routine colorectal cancer screening usually begins at age 39 and continues through age 78.  If you have risk factors for colon cancer, your health care provider may recommend that you be screened at an earlier age.  If you have a family history of colorectal cancer, talk with your health care provider about genetic screening.  Your health care provider may also recommend using home test kits to check for hidden blood in your stool.  A small camera at  the end of a tube can be used to examine your colon directly (sigmoidoscopy or colonoscopy). This is done to check for the earliest forms of colorectal cancer.  Direct examination of the colon should be repeated every 5-10 years until age 68. However, if early forms of precancerous polyps or small growths are found or if you have a family history or genetic risk for colorectal cancer, you may need to be screened more often. Skin Cancer  Check your skin from head to toe regularly.  Monitor any moles. Be sure to tell your health care provider: ? About any new moles or changes in moles, especially if there is a change in a mole's shape or color. ? If you have a mole that is larger than the size of a pencil eraser.  If any of your family members has a history of skin cancer, especially at a young age, talk with your health care provider about genetic screening.  Always use sunscreen. Apply sunscreen liberally and repeatedly throughout the day.  Whenever you are outside, protect yourself by wearing long sleeves, pants, a wide-brimmed hat, and sunglasses. What should I know about osteoporosis? Osteoporosis is a condition in which bone destruction happens more quickly than new bone creation. After menopause, you may be at an increased risk for osteoporosis. To help prevent osteoporosis or the bone fractures that can happen because of osteoporosis, the following is recommended:  If you are 46-37 years old, get at least 1,000 mg of calcium and at least 600 mg of vitamin D per day.  If you are older than age 36 but younger than age 47, get at least 1,200 mg of calcium and at least 600 mg of vitamin D per day.  If you are older than age 51, get at least 1,200 mg of calcium and at least 800 mg of vitamin D per day. Smoking and excessive alcohol intake increase the risk of osteoporosis. Eat foods that are rich in calcium and vitamin D, and do weight-bearing exercises several times each week as directed by  your health care provider. What should I know about how menopause affects my mental health? Depression may occur at any age, but it is more common as you become older. Common symptoms of depression include:  Low or sad mood.  Changes in sleep patterns.  Changes in appetite or eating patterns.  Feeling an overall lack of motivation or enjoyment of activities that you previously enjoyed.  Frequent crying spells. Talk with your health care provider if you think that you are experiencing depression. What should I know about immunizations? It is important that you get and maintain your immunizations. These include:  Tetanus, diphtheria, and pertussis (Tdap) booster vaccine.  Influenza every year before the flu season begins.  Pneumonia vaccine.  Shingles vaccine. Your health care provider may also recommend other immunizations. This  information is not intended to replace advice given to you by your health care provider. Make sure you discuss any questions you have with your health care provider. Document Released: 02/02/2006 Document Revised: 06/30/2016 Document Reviewed: 09/14/2015 Elsevier Interactive Patient Education  2019 [employer].

## 2019-03-23 ENCOUNTER — Encounter: Payer: Self-pay | Admitting: Internal Medicine

## 2019-03-23 DIAGNOSIS — Z803 Family history of malignant neoplasm of breast: Secondary | ICD-10-CM | POA: Insufficient documentation

## 2019-03-23 NOTE — Assessment & Plan Note (Signed)
Recent diagnosis,  Along with 2 other maternal relatives.  She is up to date on screening but has a daughter < 68 yrs of age.  Genetics counselling referral made

## 2019-03-23 NOTE — Assessment & Plan Note (Signed)
She has been deferring 5 year follow up due to out of pocket cost and husband's ongoing medical issues..  She plans to get caught up this year.

## 2019-03-23 NOTE — Assessment & Plan Note (Addendum)

## 2019-03-23 NOTE — Progress Notes (Signed)
Patient ID: Maria [name], female    DOB: [birth year]  Age: 58 y.o. MRN: [id number]  The patient is here for annual peventive examination and management of other chronic and acute problems.   The risk factors are reflected in the social history.  The roster of all physicians providing medical care to patient - is listed in the Snapshot section of the chart.  Activities of daily living:  The patient is 100% independent in all ADLs: dressing, toileting, feeding as well as independent mobility  Home safety : The patient has smoke detectors in the home. They wear seatbelts.  There are no firearms at home. There is no violence in the home.   There is no risks for hepatitis, STDs or HIV. There is no   history of blood transfusion. They have no travel history to infectious disease endemic areas of the world.  The patient has seen their dentist in the last six month. They have seen their eye doctor in the last year. They deny hearing difficulty .  They have deferred audiologic testing in the last year.  They do not  have excessive sun exposure. Discussed the need for sun protection: hats, long sleeves and use of sunscreen if there is significant sun exposure.   Diet: the importance of a healthy diet is discussed. They do have a healthy diet.  The benefits of regular aerobic exercise were discussed. She walks 4 times per week ,  30 minutes.   Depression screen: there are no signs or vegative symptoms of depression- irritability, change in appetite, anhedonia, sadness/tearfullness.   The following portions of the patient's history were reviewed and updated as appropriate: allergies, current medications, past family history, past medical history,  past surgical history, past social history  and problem list.  Visual acuity was not assessed per patient preference since she has regular follow up with her ophthalmologist. Hearing and body mass index were assessed and reviewed.   During the course of  the visit the patient was educated and counseled about appropriate screening and preventive services including : fall prevention , diabetes screening, nutrition counseling, colorectal cancer screening, and recommended immunizations.    CC: The primary encounter diagnosis was Visit for preventive health examination. Diagnoses of Vitamin D deficiency, Mixed hyperlipidemia, Family history of breast cancer in first degree relative, Tubular adenoma of colon, Family history of breast cancer in mother, and History of abnormal cervical Papanicolaou smear were also pertinent to this visit.  History Maria [name] has a past medical history of Abnormal cervical Papanicolaou smear (1995), Greater trochanteric bursitis of left hip (09/15/2014), and Ovarian cyst (1993).   She has no past surgical history on file.   Her family history includes Breast cancer (age of onset: 26) in her cousin; Breast cancer (age of onset: 14) in her maternal aunt; Cancer (age of onset: 72) in her maternal aunt; Clotting disorder in her father; Diabetes in her mother; Heart disease in her mother; Hyperlipidemia in her mother; Multiple sclerosis in her paternal grandmother; Parkinson's disease in her brother; Stroke in her father.She reports that she has never smoked. She has never used smokeless tobacco. She reports current alcohol use of about 4.0 standard drinks of alcohol per week. She reports that she does not use drugs.  Outpatient Medications Prior to Visit  Medication Sig Dispense Refill  . Cholecalciferol (VITAMIN D-3) 5000 units TABS Take 1 tablet by mouth 3 (three) times a week.    . conjugated estrogens (PREMARIN) vaginal cream Place 1 Applicatorful vaginally  daily. For 2 weeks at bedtime,  Then twice weekly thereafter (Patient not taking: Reported on 03/21/2019) 42.5 g 12  . gabapentin (NEURONTIN) 100 MG capsule Take 1 capsule (100 mg total) by mouth 3 (three) times daily. (Patient not taking: Reported on 01/25/2017) 90 capsule 3   No  facility-administered medications prior to visit.     Review of Systems   Patient denies headache, fevers, malaise, unintentional weight loss, skin rash, eye pain, sinus congestion and sinus pain, sore throat, dysphagia,  hemoptysis , cough, dyspnea, wheezing, chest pain, palpitations, orthopnea, edema, abdominal pain, nausea, melena, diarrhea, constipation, flank pain, dysuria, hematuria, urinary  Frequency, nocturia, numbness, tingling, seizures,  Focal weakness, Loss of consciousness,  Tremor, insomnia, depression, anxiety, and suicidal ideation.      Objective:  BP 116/66 (BP Location: Left Arm, Patient Position: Sitting, Cuff Size: Normal)   Pulse 74   Temp (!) 97.5 F (36.4 C) (Oral)   Resp 14   Ht 5\' 8"  (1.727 m)   Wt 180 lb 6.4 oz (81.8 kg)   LMP 06/22/2013   SpO2 96%   BMI 27.43 kg/m   Physical Exam  General appearance: alert, cooperative and appears stated age Head: Normocephalic, without obvious abnormality, atraumatic Eyes: conjunctivae/corneas clear. PERRL, EOM's intact. Fundi benign. Ears: normal TM's and external ear canals both ears Nose: Nares normal. Septum midline. Mucosa normal. No drainage or sinus tenderness. Throat: lips, mucosa, and tongue normal; teeth and gums normal Neck: no adenopathy, no carotid bruit, no JVD, supple, symmetrical, trachea midline and thyroid not enlarged, symmetric, no tenderness/mass/nodules Lungs: clear to auscultation bilaterally Breasts: normal appearance, no masses or tenderness Heart: regular rate and rhythm, S1, S2 normal, no murmur, click, rub or gallop Abdomen: soft, non-tender; bowel sounds normal; no masses,  no organomegaly Extremities: extremities normal, atraumatic, no cyanosis or edema Pulses: 2+ and symmetric Skin: Skin color, texture, turgor normal. No rashes or lesions Neurologic: Alert and oriented X 3, normal strength and tone. Normal symmetric reflexes. Normal coordination and gait.     Assessment & Plan:    Problem List Items Addressed This Visit    History of abnormal cervical Papanicolaou smear    She is due PAP smears every 3 years given remoteness of abnormal history       Visit for preventive health examination - Primary    age appropriate education and counseling updated, referrals for preventative services and immunizations addressed, dietary and smoking counseling addressed, most recent labs reviewed.  I have personally reviewed and have noted:  1) the patient's medical and social history 2) The pt's use of alcohol, tobacco, and illicit drugs 3) The patient's current medications and supplements 4) Functional ability including ADL's, fall risk, home safety risk, hearing and visual impairment 5) Diet and physical activities 6) Evidence for depression or mood disorder 7) The patient's height, weight, and BMI have been recorded in the chart  I have made referrals, and provided counseling and education based on review of the above        Tubular adenoma of colon    She has been deferring 5 year follow up due to out of pocket cost and husband's ongoing medical issues..  She plans to get caught up this year.       Vitamin D deficiency   Relevant Orders   VITAMIN D 25 Hydroxy (Vit-D Deficiency, Fractures) (Completed)   Family history of breast cancer in mother    Recent diagnosis,  Along with 2 other maternal relatives.  She is up to date on screening but has a daughter < 17 yrs of age.  Genetics counselling referral made       Other Visit Diagnoses    Mixed hyperlipidemia       Relevant Orders   Comprehensive metabolic panel (Completed)   TSH (Completed)   Lipid panel (Completed)   Family history of breast cancer in first degree relative       Relevant Orders   Ambulatory referral to Genetics      I have discontinued Lindaann L. Menton's gabapentin and conjugated estrogens. I am also having her maintain her Vitamin D-3.  No orders of the defined types were placed  in this encounter.   Medications Discontinued During This Encounter  Medication Reason  . conjugated estrogens (PREMARIN) vaginal cream Error  . gabapentin (NEURONTIN) 100 MG capsule Error    Follow-up: No follow-ups on file.   Crecencio Mc, MD

## 2019-03-23 NOTE — Assessment & Plan Note (Signed)
She is due PAP smears every 3 years given remoteness of abnormal history

## 2019-04-07 ENCOUNTER — Encounter: Payer: Self-pay | Admitting: Internal Medicine

## 2019-04-21 ENCOUNTER — Other Ambulatory Visit: Payer: Self-pay

## 2019-04-21 ENCOUNTER — Inpatient Hospital Stay: Payer: Managed Care, Other (non HMO) | Attending: Oncology | Admitting: Licensed Clinical Social Worker

## 2019-04-21 ENCOUNTER — Encounter: Payer: Self-pay | Admitting: Licensed Clinical Social Worker

## 2019-04-21 DIAGNOSIS — Z803 Family history of malignant neoplasm of breast: Secondary | ICD-10-CM

## 2019-04-21 DIAGNOSIS — Z808 Family history of malignant neoplasm of other organs or systems: Secondary | ICD-10-CM

## 2019-04-21 NOTE — Progress Notes (Signed)
REFERRING PROVIDER: Crecencio Mc, MD [place], [place] [address]  PRIMARY PROVIDER:  Crecencio Mc, MD  PRIMARY REASON FOR VISIT:  1. Family history of breast cancer   2. Family history of brain cancer   3. Family history of skin cancer     I connected with Maria [name] at 11 am by [employer] and verified that I am speaking with the correct person using two identifiers. I discussed the limitations, risks, security and privacy concerns of performing an evaluation and management service by [employer] and the availability of in person appointments. I also discussed with the patient that there may be a charge related to this service. The patient expressed understanding and agreed to proceed.  Patient's Location: Home Genetic counselor location: Home  HISTORY OF PRESENT ILLNESS:   Maria [name], a 58 y.o. female, was seen for a [facility] cancer genetics consultation at the request of Dr. Derrel Nip due to a family history of breast cancer.  Maria [name] presents to clinic today to discuss the possibility of a hereditary predisposition to cancer, genetic testing, and to further clarify her future cancer risks, as well as potential cancer risks for family members.    Maria [name] is a 58 y.o. female with no personal history of cancer.    RISK FACTORS:  Menarche was at age 62.  First live birth at age 31.  OCP use for approximately 8 years.  Ovaries intact: Yes.  Hysterectomy: No Menopausal status: Menopause at 54 HRT use: 0 years Colonoscopy: Yes in 2013, 2 polyps Mammogram within the last year: Yes Number of breast biopsies: 0 Any excessive radiation exposure in the past: No  Past Medical History:  Diagnosis Date  . Abnormal cervical Papanicolaou smear 1995   s/p LEEP   . Family history of brain cancer   . Family history of breast cancer   . Family history of skin cancer   . Greater trochanteric bursitis of left hip 09/15/2014   Ultrasound-guided injection September 15, 2049   . Ovarian cyst 1993   s/p laparascopic surgery    No past surgical history on file.  Social History   Socioeconomic History  . Marital status: Married    Spouse name: Not on file  . Number of children: Not on file  . Years of education: Not on file  . Highest education level: Not on file  Occupational History  . Not on file  Social Needs  . Financial resource strain: Not on file  . Food insecurity:    Worry: Not on file    Inability: Not on file  . Transportation needs:    Medical: Not on file    Non-medical: Not on file  Tobacco Use  . Smoking status: Never Smoker  . Smokeless tobacco: Never Used  Substance and Sexual Activity  . Alcohol use: Yes    Alcohol/week: 4.0 standard drinks    Types: 4 Cans of beer per week  . Drug use: No  . Sexual activity: Yes  Lifestyle  . Physical activity:    Days per week: Not on file    Minutes per session: Not on file  . Stress: Not on file  Relationships  . Social connections:    Talks on phone: Not on file    Gets together: Not on file    Attends religious service: Not on file    Active member of club or organization: Not on file    Attends meetings of  clubs or organizations: Not on file    Relationship status: Not on file  Other Topics Concern  . Not on file  Social History Narrative  . Not on file     FAMILY HISTORY:  We obtained a detailed, 4-generation family history.  Significant diagnoses are listed below: Family History  Problem Relation Age of Onset  . Diabetes Mother   . Heart disease Mother   . Hyperlipidemia Mother   . Breast cancer Mother 20  . Stroke Father        acute SMA thrombosis  . Clotting disorder Father   . Cancer Maternal Aunt 63       breast metastatic   . Breast cancer Maternal Aunt 57  . Parkinson's disease Brother   . Multiple sclerosis Paternal Grandmother   . Breast cancer Cousin 43       mat cousin  . Brain cancer Maternal Uncle 70   . Skin cancer Maternal Grandfather    Maria [name] has one son, 40, and a one daughter, 64. She has one brother, 41, no cancer history.  Maria [name] mother is living at 22. She was recently diagnosed with breast cancer at 91 and this was treated with lumpectomy. She has a history of colon polyps. Maria [name] has 3 maternal uncles, 2 maternal aunts. One of her aunts had breast cancer at 70 and died in her early 72s of a metastatic recurrence. One of her uncles had brain cancer at 35 and died at 31. This uncle had a daughter who was diagnosed with breast cancer at 46 and died at 59. Maria [name] maternal grandfather died at 33 and had a history of skin cancer. Her maternal grandmother died at 70.  Maria [name] father died at 77 due to issues from a blood clot. He never had cancer. Maria [name] had 4 paternal aunts, all are now deceased with no history of cancer, and 1 paternal uncle who is living. No cancers in her paternal cousins as far as she knows, although she reports limited history. Her paternal grandfather died of a heart attack, unknown age of death, and paternal grandmother is deceased, unknown age of death.  Maria [name] is unaware of previous family history of genetic testing for hereditary cancer risks. Patient's maternal ancestors are of Caucasian descent, and paternal ancestors are of [place] descent. There is no reported Ashkenazi Jewish ancestry. There is no known consanguinity.  GENETIC COUNSELING ASSESSMENT: Maria [name] is a 58 y.o. female with a family history of breast cancer which is somewhat suggestive of a hereditary cancer syndrome such as Hereditary  Breast and Ovarian Cancer. We, therefore, discussed and recommended the following at today's visit.   DISCUSSION: We discussed that 5-10% of breast cancer is hereditary, with most cases associated with BRCA1/BRCA2.  There are other genes that can be associated with hereditary breast cancer syndromes.  These  include PALB2, ATM, CHEK2, etc.  We reviewed the characteristics, features and inheritance patterns of hereditary cancer syndromes. We also discussed genetic testing, including the appropriate family members to test, the process of testing, insurance coverage and turn-around-time for results. We discussed the implications of a negative, positive and/or variant of uncertain significant result. We recommended Maria [name] pursue genetic testing for the [facility] Common Hereditary Cancers Panel + Brain Cancer Panel.   The Common Hereditary Cancers Panel offered by [facility] includes sequencing and/or deletion duplication testing of the following 48 genes: APC, ATM, AXIN2, BARD1, BMPR1A, BRCA1, BRCA2, BRIP1, CDH1, CDKN2A (p14ARF), CDKN2A (p16INK4a),  CKD4, CHEK2, CTNNA1, DICER1, EPCAM (Deletion/duplication testing only), GREM1 (promoter region deletion/duplication testing only), KIT, MEN1, MLH1, MSH2, MSH3, MSH6, MUTYH, NBN, NF1, NHTL1, PALB2, PDGFRA, PMS2, POLD1, POLE, PTEN, RAD50, RAD51C, RAD51D, RNF43, SDHB, SDHC, SDHD, SMAD4, SMARCA4. STK11, TP53, TSC1, TSC2, and VHL.  The following genes were evaluated for sequence changes only: SDHA and HOXB13 c.251G>A variant only.  The CNS/Brain Cancer Panel offered by [facility] includes sequencing and/or deletion duplication testing of the following 42 genes: ALK, APC, BAP1, BARD1, CDK4, CDKN2A, DICER1, EPCAM, EZH2, GPC3, HRAS, KIF1B, MEN1, MLH1, MSH2, MSH6, NF1, NF2, PHOX2B, PMS2, POT1, PRKAR1A, PTCH2, PTEN, RB1, SMARCA4, SMARCB1, SMARCE1, SUFU, TP53, TSC1, TSC2, and VHL.   Based on Maria [name] family history of cancer, she meets medical criteria for genetic testing. Despite that she meets criteria, she may still have an out of pocket cost. The lab will notify her of an OOP if any.   Based on the patient's personal and family history, a statistical model Harriett Rush was used to estimate her risk of developing breast cancer. This estimates her lifetime risk of  developing breast to be approximately 18.3%. This estimation is in the setting of negative results and may change depending on the results of her genetic testing.  The patient's lifetime breast cancer risk is a preliminary estimate based on available information using one of several models endorsed by the [place] (ACS). The ACS recommends consideration of breast MRI screening as an adjunct to mammography for patients at high risk (defined as 20% or greater lifetime risk).   PLAN: After considering the risks, benefits, and limitations, Maria [name] provided informed consent to pursue genetic testing. [facility] will send a saliva kit to her home, and she will send it back to [facility] for analysis of the Common Hereditary Cancers Panel + Brain Cancer Panel. Results should be available within approximately 2-3 weeks' time, at which point they will be disclosed by telephone to Maria [name], as will any additional recommendations warranted by these results. Maria [name] will receive a summary of her genetic counseling visit and a copy of her results once available. This information will also be available in Epic.   Based on Ms. Honse's family history, we recommended her mother and maternal relatives have genetic counseling and testing. Ms. Stacey will let us know if we can be of any assistance in coordinating genetic counseling and/or testing for this family member.   Lastly, we encouraged Ms. Sturkey to remain in contact with cancer genetics annually so that we can continuously update the family history and inform her of any changes in cancer genetics and testing that may be of benefit for this family.   Ms. Thai questions were answered to her satisfaction today. Our contact information was provided should additional questions or concerns arise. Thank you for the referral and allowing [place] to share in the care of your patient.   Faith Rogue, MS Genetic  [email or web address] [email or web address] Phone: [phone]  The patient was seen for a total of 30 minutes in virtual genetic counseling.  DrMarland Kitchen Grayland Ormond was available for discussion regarding this case.

## 2019-04-22 ENCOUNTER — Inpatient Hospital Stay: Payer: Managed Care, Other (non HMO) | Admitting: Licensed Clinical Social Worker

## 2019-05-06 ENCOUNTER — Ambulatory Visit: Payer: Self-pay | Admitting: Licensed Clinical Social Worker

## 2019-05-06 ENCOUNTER — Telehealth: Payer: Self-pay | Admitting: Licensed Clinical Social Worker

## 2019-05-06 ENCOUNTER — Encounter: Payer: Self-pay | Admitting: Licensed Clinical Social Worker

## 2019-05-06 DIAGNOSIS — Z1379 Encounter for other screening for genetic and chromosomal anomalies: Secondary | ICD-10-CM

## 2019-05-06 DIAGNOSIS — Z803 Family history of malignant neoplasm of breast: Secondary | ICD-10-CM

## 2019-05-06 DIAGNOSIS — Z808 Family history of malignant neoplasm of other organs or systems: Secondary | ICD-10-CM

## 2019-05-06 NOTE — Telephone Encounter (Signed)
Revealed negative genetic testing.  Revealed that a VUS in {POLD1 was identified. This normal result is reassuring.  It is unlikely that there is an increased risk of  cancer due to a mutation in one of these genes.  However, genetic testing is not perfect, and cannot definitively rule out a hereditary cause.  It will be important for her to keep in contact with genetics to learn if any additional testing may be needed in the future.    ?

## 2019-05-06 NOTE — Progress Notes (Signed)
HPI:  Ms. Rivenbark was previously seen in the [facility] clinic due to a family history of breast cancer and concerns regarding a hereditary predisposition to cancer. Please refer to our prior cancer genetics clinic note for more information regarding our discussion, assessment and recommendations, at the time. Ms. Huettner recent genetic test results were disclosed to her, as were recommendations warranted by these results. These results and recommendations are discussed in more detail below.  CANCER HISTORY:   No history exists.    FAMILY HISTORY:  We obtained a detailed, 4-generation family history.  Significant diagnoses are listed below: Family History  Problem Relation Age of Onset  . Diabetes Mother   . Heart disease Mother   . Hyperlipidemia Mother   . Breast cancer Mother 84  . Stroke Father        acute SMA thrombosis  . Clotting disorder Father   . Cancer Maternal Aunt 63       breast metastatic   . Breast cancer Maternal Aunt 48  . Parkinson's disease Brother   . Multiple sclerosis Paternal Grandmother   . Breast cancer Cousin 40       mat cousin  . Brain cancer Maternal Uncle 70  . Skin cancer Maternal Grandfather    Ms. Gayman has one son, 29, and a one daughter, 45. She has one brother, 88, no cancer history.  Ms. Bruss mother is living at 42. She was recently diagnosed with breast cancer at 53 and this was treated with lumpectomy. She has a history of colon polyps. Ms. Gawthrop has 3 maternal uncles, 2 maternal aunts. One of her aunts had breast cancer at 4 and died in her early 49s of a metastatic recurrence. One of her uncles had brain cancer at 44 and died at 7. This uncle had a daughter who was diagnosed with breast cancer at 70 and died at 62. Ms. Allender maternal grandfather died at 30 and had a history of skin cancer. Her maternal grandmother died at 78.  Ms. Jarecki father died at 46 due to issues from a blood  clot. He never had cancer. Ms. Cuoco had 4 paternal aunts, all are now deceased with no history of cancer, and 1 paternal uncle who is living. No cancers in her paternal cousins as far as she knows, although she reports limited history. Her paternal grandfather died of a heart attack, unknown age of death, and paternal grandmother is deceased, unknown age of death.  Ms. Marchesi is unaware of previous family history of genetic testing for hereditary cancer risks. Patient's maternal ancestors are of Caucasian descent, and paternal ancestors are of [place] descent. There is no reported Ashkenazi Jewish ancestry. There is no known consanguinity.  GENETIC TEST RESULTS: Genetic testing reported out on 05/06/2019 through the [facility] Common Hereditary Cancers Panel + Brain cancer panel found no pathogenic mutations.   The Common Hereditary Cancers Panel + Brain Cancer Panel offered by [facility] includes sequencing and/or deletion duplication testing of the following 60 genes: AIP, ALK, APC, ATM, AXIN2, BARD1, BMPR1A, BRCA1, BRCA2, BRIP1, CDH1, CDK4, CDKN2A (p14ARF), CDKN2A (p16INK4a), CHEK2, CTNNA1, DICER1, EPCAM*, GREM1*, HOXB13, HRAS, KIT, LZTR1, MEN1, MLH1, MSH2, MSH3, MSH6, MUTYH, NBN, NF1, NF2, NTHL1, PALB2, PDGFRA, PHOX2B*, PMS2, POLD1, POLE, PRKAR1A, PTCH1, PTEN, RAD50, RAD51C, RAD51D, RB1, RNF43, SDHA*, SDHB, SDHC, SDHD, SMAD4, SMARCA4, SMARCB1, SMARCE1, STK11, SUFU, TP53, TSC1,TSC2, VHL.  The test report has been scanned into EPIC and is located under the Molecular Pathology section of the Results Review  tab.  A portion of the result report is included below for reference.     We discussed with Ms. Crader that because current genetic testing is not perfect, it is possible there may be a gene mutation in one of these genes that current testing cannot detect, but that chance is small.  We also discussed, that there could be another gene that has not yet been discovered, or that we have not  yet tested, that is responsible for the cancer diagnoses in the family. It is also possible there is a hereditary cause for the cancer in the family that Ms. Homen did not inherit and therefore was not identified in her testing.  Therefore, it is important to remain in touch with cancer genetics in the future so that we can continue to offer Ms. Kist the most up to date genetic testing.   Genetic testing did identify a variant of uncertain significance (VUS) was identified in the POLD1 gene called [id number].  At this time, it is unknown if this variant is associated with increased cancer risk or if this is a normal finding, but most variants such as this get reclassified to being inconsequential. It should not be used to make medical management decisions. With time, we suspect the lab will determine the significance of this variant, if any. If we do learn more about it, we will try to contact Ms. Delgreco to discuss it further. However, it is important to stay in touch with [place] periodically and keep the address and phone number up to date.  ADDITIONAL GENETIC TESTING: We discussed with Ms. Isip that her genetic testing was fairly extensive.  If there are genes identified to increase cancer risk that can be analyzed in the future, we would be happy to discuss and coordinate this testing at that time.    CANCER SCREENING RECOMMENDATIONS: Ms. Davoli test result is considered negative (normal).  This means that we have not identified a hereditary cause for her  family history of cancer at this time.  While reassuring, this does not definitively rule out a hereditary predisposition to cancer. It is still possible that there could be genetic mutations that are undetectable by current technology. There could be genetic mutations in genes that have not been tested or identified to increase cancer risk.  Therefore, it is recommended she continue to follow the cancer management and screening  guidelines provided by her oncology and primary healthcare provider.   An individual's cancer risk and medical management are not determined by genetic test results alone. Overall cancer risk assessment incorporates additional factors, including personal medical history, family history, and any available genetic information that may result in a personalized plan for cancer prevention and surveillance  Based on the patient's personal and family history as well as genetic test results, a statistical model Harriett Rush was used to estimate her risk of developing breast cancer. This estimates her lifetime risk of developing breast to be approximately 18.3%. The patient's lifetime breast cancer risk is a preliminary estimate based on available information using one of several models endorsed by the [place] (ACS). The ACS recommends consideration of breast MRI screening as an adjunct to mammography for patients at high risk (defined as 20% or greater lifetime risk).   RECOMMENDATIONS FOR FAMILY MEMBERS:  Relatives in this family might be at some increased risk of developing cancer, over the general population risk, simply due to the family history of cancer.  We recommended women in this family have  a yearly mammogram beginning at age 56, or 12 years younger than the earliest onset of cancer, an annual clinical breast exam, and perform monthly breast self-exams. Women in this family should also have a gynecological exam as recommended by their primary provider. All family members should have a colonoscopy by age 6.  It is also possible there is a hereditary cause for the cancer in Ms. Ertel's family that she did not inherit and therefore was not identified in her.  Based on Ms. Meland's family history, we recommended her maternal relatives have genetic counseling and testing. Ms. Hinds will let us know if we can be of any assistance in coordinating genetic counseling and/or testing  for these family members.  FOLLOW-UP: Lastly, we discussed with Ms. Loughmiller that cancer genetics is a rapidly advancing field and it is possible that new genetic tests will be appropriate for her and/or her family members in the future. We encouraged her to remain in contact with cancer genetics on an annual basis so we can update her personal and family histories and let her know of advances in cancer genetics that may benefit this family.   Our contact number was provided. Ms. Mccumbers questions were answered to her satisfaction, and she knows she is welcome to call us at anytime with additional questions or concerns.   Faith Rogue, MS Genetic Counselor [email or web address] Phone: [phone]

## 2019-05-26 ENCOUNTER — Other Ambulatory Visit: Payer: Self-pay

## 2019-05-26 ENCOUNTER — Ambulatory Visit
Admission: RE | Admit: 2019-05-26 | Discharge: 2019-05-26 | Disposition: A | Discharge status: Home / Self Care | Payer: Managed Care, Other (non HMO) | Source: Ambulatory Visit | Attending: Internal Medicine | Admitting: Internal Medicine

## 2019-05-26 DIAGNOSIS — Z1239 Encounter for other screening for malignant neoplasm of breast: Secondary | ICD-10-CM

## 2019-05-26 DIAGNOSIS — Z1231 Encounter for screening mammogram for malignant neoplasm of breast: Secondary | ICD-10-CM | POA: Insufficient documentation

## 2019-10-27 ENCOUNTER — Other Ambulatory Visit: Payer: Self-pay | Admitting: Internal Medicine

## 2019-10-27 MED ORDER — ESTROGENS, CONJUGATED 0.625 MG/GM VA CREA: TOPICAL_CREAM | VAGINAL | 12 refills | Status: DC

## 2019-11-07 ENCOUNTER — Ambulatory Visit: Payer: Self-pay

## 2019-11-07 ENCOUNTER — Encounter: Payer: Self-pay | Admitting: Emergency Medicine

## 2019-11-07 ENCOUNTER — Ambulatory Visit (INDEPENDENT_AMBULATORY_CARE_PROVIDER_SITE_OTHER): Payer: Managed Care, Other (non HMO)

## 2019-11-07 ENCOUNTER — Ambulatory Visit
Admission: EM | Admit: 2019-11-07 | Discharge: 2019-11-07 | Disposition: A | Discharge status: Home / Self Care | Payer: Managed Care, Other (non HMO) | Attending: Emergency Medicine | Admitting: Emergency Medicine

## 2019-11-07 ENCOUNTER — Other Ambulatory Visit: Payer: Self-pay

## 2019-11-07 ENCOUNTER — Telehealth: Payer: Self-pay | Admitting: Internal Medicine

## 2019-11-07 DIAGNOSIS — R079 Chest pain, unspecified: Secondary | ICD-10-CM | POA: Diagnosis not present

## 2019-11-07 DIAGNOSIS — R0789 Other chest pain: Secondary | ICD-10-CM | POA: Insufficient documentation

## 2019-11-07 MED ORDER — IBUPROFEN 600 MG PO TABS: 600.0000 mg | ORAL_TABLET | Freq: Four times a day (QID) | ORAL | 0 refills | Status: AC | PRN

## 2019-11-07 NOTE — Telephone Encounter (Signed)
Patient is calling reporting Middle to Left rib pain since Tuesday. The practice does not have any appt. Patient is requesting advice. Thank you   Call to patient- patient states she was offered virtual visit on Monday- she is going to go ahead to [facility] to have this addressed. Advised patient I would make Dr Derrel Nip aware. ( No triage - due to patient going to UC today)

## 2019-11-07 NOTE — ED Provider Notes (Signed)
HPI  SUBJECTIVE:  Maria [name] is a 58 y.o. female who presents with 2 days of intermittent sharp, burning epigastric pain described as "soreness" that radiates under her left ribs.  States that it completely resolved yesterday, but woke her up from sleep last night.  No trauma, change in her physical activity although she does work out with 5 pound dumbbells.  No nausea, diaphoresis, fevers.  No shortness of breath, coughing, hemoptysis.  No rash, syncope, palpitations.  No exertional component.  It is worse with lying down.  No belching, water brash.  No abdominal pain, calf pain, swelling, recent surgery, prolonged immobilization.  She started vaginal Premarin last week, no other exogenous estrogen.  She tried ibuprofen 400 mg with significant improvement in her symptoms.  Lying flat in her back also helps.  Symptoms are worse with lying on her side, torso rotation, bending over.  Past medical history of heartburn.  No formal diagnosis of GERD.  No history of PE, DVT, cancer, MI, diabetes, hypertension, smoking, coronary disease, hypercholesterolemia.  Family history negative for early MI, aortic dissection.  PMD: Dr. Derrel Nip.   Past Medical History:  Diagnosis Date  . Abnormal cervical Papanicolaou smear 1995   s/p LEEP   . Family history of brain cancer   . Family history of breast cancer   . Family history of skin cancer   . Greater trochanteric bursitis of left hip 09/15/2014   Ultrasound-guided injection September 15, 2049   . Ovarian cyst 1993   s/p laparascopic surgery    History reviewed. No pertinent surgical history.  Family History  Problem Relation Age of Onset  . Diabetes Mother   . Heart disease Mother   . Hyperlipidemia Mother   . Breast cancer Mother 60  . Stroke Father        acute SMA thrombosis  . Clotting disorder Father   . Cancer Maternal Aunt 63       breast metastatic   . Breast cancer Maternal Aunt 50  . Parkinson's disease Brother   . Multiple  sclerosis Paternal Grandmother   . Breast cancer Cousin 22       mat cousin  . Brain cancer Maternal Uncle 70  . Skin cancer Maternal Grandfather     Social History   Tobacco Use  . Smoking status: Never Smoker  . Smokeless tobacco: Never Used  Substance Use Topics  . Alcohol use: Yes    Alcohol/week: 4.0 standard drinks    Types: 4 Cans of beer per week  . Drug use: No    No current facility-administered medications for this encounter.   Current Outpatient Medications:  .  Cholecalciferol (VITAMIN D-3) 5000 units TABS, Take 1 tablet by mouth 3 (three) times a week., Disp: , Rfl:  .  conjugated estrogens (PREMARIN) vaginal cream, 1 applicator intravaginally at bedtime for 2 weeks, then twice weekly thereafter, Disp: 42.5 g, Rfl: 12 .  ibuprofen (ADVIL) 600 MG tablet, Take 1 tablet (600 mg total) by mouth every 6 (six) hours as needed., Disp: 30 tablet, Rfl: 0  Allergies  Allergen Reactions  . Penicillins Hives     ROS  As noted in HPI.   Physical Exam  BP (!) 150/83 (BP Location: Right Arm)   Pulse 71   Temp 98.2 F (36.8 C) (Oral)   Resp 14   Ht 5\' 8"  (1.727 m)   Wt 80.7 kg   LMP 06/22/2013   SpO2 100%   BMI 27.06 kg/m  Constitutional: Well developed, well nourished, no acute distress Eyes:  EOMI, conjunctiva normal bilaterally HENT: Normocephalic, atraumatic,mucus membranes moist Respiratory: Normal inspiratory effort.  Questionable rhonchi posterior left upper lobe.  Good air movement. Cardiovascular: Normal rate regular rhythm, no murmurs rubs or gallops.  No tenderness along the sternum, costochondral junctions, or along the ribs.   RP, DP 2+ and equal bilaterally  GI: nondistended soft, nontender, active bowel sounds.  No palpable pulsatile masses.   Skin: No rash over her chest or abdomen, skin intact Musculoskeletal: no deformities calves symmetric, nontender.  1+ edema bilaterally.  Patient states that this is baseline. Neurologic: Alert & oriented  x 3, no focal neuro deficits Psychiatric: Speech and behavior appropriate   ED Course   Medications - No data to display  Orders Placed This Encounter  Procedures  . DG Chest 2 View    Standing Status:   Standing    Number of Occurrences:   1    Order Specific Question:   Reason for Exam (SYMPTOM  OR DIAGNOSIS REQUIRED)    Answer:   epigastric CP  . ED EKG    Standing Status:   Standing    Number of Occurrences:   1    Order Specific Question:   Reason for Exam    Answer:   Chest Pain  . EKG 12-Lead    Standing Status:   Standing    Number of Occurrences:   1    No results found for this or any previous visit (from the past 24 hour(s)). Dg Chest 2 View  Result Date: 11/07/2019 CLINICAL DATA:  Acute chest pain. EXAM: CHEST - 2 VIEW COMPARISON:  None. FINDINGS: The heart size and mediastinal contours are within normal limits. Both lungs are clear. No pneumothorax or pleural effusion is noted. The visualized skeletal structures are unremarkable. IMPRESSION: No active cardiopulmonary disease. Electronically Signed   By: Marijo Conception M.D.   On: 11/07/2019 14:18    ED Clinical Impression  1. Musculoskeletal chest pain      ED Assessment/Plan  EKG: Normal sinus rhythm, rate 69.  Normal axis, normal intervals.  No hypertrophy.  No ST-T wave changes concerning for ischemia.  Patient was symptomatic while EKG was obtained.  Reviewed imaging independently.  Normal mediastinum as read by me.  No pneumothorax, pleural effusion pneumonia.  See radiology report for full details.  Did not do a D-dimer as she is satting 100% on room air, has normal vitals, no calf tenderness or swelling, no other risk factors for PE.  Does not meet PERC criteria due to age, but think that PE is less likely.  Considered aortic dissection, but pulses are equal, and chest x-ray is normal.  It seems to be aggravated with movement.   Doubt ACS as she has a normal EKG, no cardiac risk factors, and the  pain is reproducible with movement.  Presentation consistent with musculoskeletal chest pain.  It has responded well to NSAIDs.  Will send home with ibuprofen 600 mg combined with 1 g of Tylenol 3-4 times a day as needed for pain, rest, follow-up with her primary care physician in 3 days if not getting better, giving her strict ER return precautions.  Discussed imaging, MDM, treatment plan, and plan for follow-up with patient. Discussed sn/sx that should prompt return to the ED. patient agrees with plan.   Meds ordered this encounter  Medications  . ibuprofen (ADVIL) 600 MG tablet    Sig: Take 1 tablet (600  mg total) by mouth every 6 (six) hours as needed.    Dispense:  30 tablet    Refill:  0    *This clinic note was created using [employer]. Therefore, there may be occasional mistakes despite careful proofreading.   ?    Melynda Ripple, MD 11/07/19 1925

## 2019-11-07 NOTE — Discharge Instructions (Signed)
I suspect that this is musculoskeletal, your EKG and chest x-ray were both normal.  Take 600 mg of ibuprofen combined with 1 g of Tylenol 3-4 times a day as needed for pain.  I would stop working out for at least 5 days.  Go immediately to the ER for fevers, shortness of breath, if you cough up blood, if the chest pain gets worse with exertion, and palpitations, sweatiness and nausea with the chest pain, or for any other concerns.  You can also try some Pepcid to see if this does not help.

## 2019-11-07 NOTE — Telephone Encounter (Signed)
Lm on vm to call office and set up a virtual with Dr. Caryl Bis for today.

## 2019-11-07 NOTE — Telephone Encounter (Signed)
Pt called back I offered pt two options which was to see Lauren on Monday in office or Dr Caryl Bis virtual pt refused both options and stated she will go to Urgent Care in [place].

## 2019-11-07 NOTE — ED Triage Notes (Signed)
Patient c/o left sided rib and chest pain that started 2 days ago.  Patient states that she did work out with 5lb dumbbells prior.  Patient denies SOB. Patient denies injury or fall.

## 2020-02-16 ENCOUNTER — Other Ambulatory Visit: Payer: Self-pay

## 2020-02-16 ENCOUNTER — Ambulatory Visit: Payer: Managed Care, Other (non HMO) | Admitting: Nurse Practitioner

## 2020-02-16 VITALS — BP 140/98 | HR 85 | Temp 97.0°F | Resp 18 | Ht 68.0 in | Wt 180.0 lb

## 2020-02-16 DIAGNOSIS — M545 Low back pain, unspecified: Secondary | ICD-10-CM

## 2020-02-16 MED ORDER — CYCLOBENZAPRINE HCL 5 MG PO TABS: 5.0000 mg | ORAL_TABLET | Freq: Three times a day (TID) | ORAL | 1 refills | Status: DC | PRN

## 2020-02-16 NOTE — Progress Notes (Signed)
SUBJECTIVE:  Maria [name] is a 59 y.o. female who presents to the [facility] with c/o low back pain. The pain started on 2/19. She describes the pain as a burning sensation.  The pain is positional with bending or lifting, but the pain does not radiate. She rates the pain as  8/10 initially but has decreased to 5/10 with self treatment. She reports having had similar problems with her back in the past. Maria [name] reports taking Advil and also Flexeril that was prescribed at another time for similar symptoms. [name] reports that since taking the medications since Friday her symptoms have improved by about 80%. [name] denies UTI symptoms or loss of control of bladder or bowels.   BP (!) 140/98 (BP Location: Right Arm, Patient Position: Sitting, Cuff Size: Normal)   Pulse 85   Temp (!) 97 F (36.1 C) (Temporal)   Resp 18   Ht 5\' 8"  (1.727 m)   Wt 180 lb (81.6 kg)   LMP 06/22/2013   SpO2 98%   BMI 27.37 kg/m    OBJECTIVE: Vital signs as noted above. Patient appears to be in mild to moderate pain, steady gait noted. Lumbosacral spine area reveals no local tenderness or mass. Pain to bilateral lumbar area with ROM. Straight leg raise is positive at 35 degrees bilateral. Grips are equal, radial and pedal pulses 2+, adequate circulation. Good touch sensation. Ambulatory without difficulty but appears moderately uncomfortable. DTR's 2+ upper and lower extremities.   Lumbar spine X-Ray: not indicated.   ASSESSMENT:  Acute lumbar strain  PLAN: For acute pain, rest, intermittent application of cold packs (later, may switch to heat, but do not sleep on heating pad), analgesics and muscle relaxants are recommended. Discussed  a home back care exercise program with flexion exercise routine. (Given web site to go to) Proper lifting with avoidance of heavy lifting discussed. Consider Physical Therapy and XRay studies if not improving. Follow up with PCP or return to clinic prn if these  symptoms worsen or fail to improve as anticipated. Rx: Flexeril 5mg  PO TID prn muscle spasm.

## 2020-02-16 NOTE — Patient Instructions (Addendum)
For acute pain, rest, intermittent application of cold packs (later, may switch to heat, but do not sleep on heating pad), Advil and muscle relaxants are recommended. Discussed longer term treatment plan of prn NSAID's and discussed a home back care exercise program with flexion exercise routine. Proper lifting with avoidance of heavy lifting discussed. Consider Physical Therapy and XRay studies if not improving. Call or return to clinic prn if these symptoms worsen or fail to improve as anticipated. Back exercises.  Do not drive while taking the the muscle relaxer as it can make you sleepy. Follow  Up with your doctor or return to the Clinic as needed.   Acute Back Pain, Adult Acute back pain is sudden and usually short-lived. It is often caused by an injury to the muscles and tissues in the back. The injury may result from:  A muscle or ligament getting overstretched or torn (strained). Ligaments are tissues that connect bones to each other. Lifting something improperly can cause a back strain.  Wear and tear (degeneration) of the spinal disks. Spinal disks are circular tissue that provides cushioning between the bones of the spine (vertebrae).  Twisting motions, such as while playing sports or doing yard work.  A hit to the back.  Arthritis. You may have a physical exam, lab tests, and imaging tests to find the cause of your pain. Acute back pain usually goes away with rest and home care. Follow these instructions at home: Managing pain, stiffness, and swelling  Take over-the-counter and prescription medicines only as told by your health care provider.  Your health care provider may recommend applying ice during the first 24-48 hours after your pain starts. To do this: ? Put ice in a plastic bag. ? Place a towel between your skin and the bag. ? Leave the ice on for 20 minutes, 2-3 times a day.  If directed, apply heat to the affected area as often as told by your health care provider.  Use the heat source that your health care provider recommends, such as a moist heat pack or a heating pad. ? Place a towel between your skin and the heat source. ? Leave the heat on for 20-30 minutes. ? Remove the heat if your skin turns bright red. This is especially important if you are unable to feel pain, heat, or cold. You have a greater risk of getting burned. Activity   Do not stay in bed. Staying in bed for more than 1-2 days can delay your recovery.  Sit up and stand up straight. Avoid leaning forward when you sit, or hunching over when you stand. ? If you work at a desk, sit close to it so you do not need to lean over. Keep your chin tucked in. Keep your neck drawn back, and keep your elbows bent at a right angle. Your arms should look like the letter "L." ? Sit high and close to the steering wheel when you drive. Add lower back (lumbar) support to your car seat, if needed.  Take short walks on even surfaces as soon as you are able. Try to increase the length of time you walk each day.  Do not sit, drive, or stand in one place for more than 30 minutes at a time. Sitting or standing for long periods of time can put stress on your back.  Do not drive or use heavy machinery while taking prescription pain medicine.  Use proper lifting techniques. When you bend and lift, use positions that put  less stress on your back: ? Bend your knees. ? Keep the load close to your body. ? Avoid twisting.  Exercise regularly as told by your health care provider. Exercising helps your back heal faster and helps prevent back injuries by keeping muscles strong and flexible.  Work with a physical therapist to make a safe exercise program, as recommended by your health care provider. Do any exercises as told by your physical therapist. Lifestyle  Maintain a healthy weight. Extra weight puts stress on your back and makes it difficult to have good posture.  Avoid activities or situations that make you  feel anxious or stressed. Stress and anxiety increase muscle tension and can make back pain worse. Learn ways to manage anxiety and stress, such as through exercise. General instructions  Sleep on a firm mattress in a comfortable position. Try lying on your side with your knees slightly bent. If you lie on your back, put a pillow under your knees.  Follow your treatment plan as told by your health care provider. This may include: ? Cognitive or behavioral therapy. ? Acupuncture or massage therapy. ? Meditation or yoga. Contact a health care provider if:  You have pain that is not relieved with rest or medicine.  You have increasing pain going down into your legs or buttocks.  Your pain does not improve after 2 weeks.  You have pain at night.  You lose weight without trying.  You have a fever or chills. Get help right away if:  You develop new bowel or bladder control problems.  You have unusual weakness or numbness in your arms or legs.  You develop nausea or vomiting.  You develop abdominal pain.  You feel faint. Summary  Acute back pain is sudden and usually short-lived.  Use proper lifting techniques. When you bend and lift, use positions that put less stress on your back.  Take over-the-counter and prescription medicines and apply heat or ice as directed by your health care provider. This information is not intended to replace advice given to you by your health care provider. Make sure you discuss any questions you have with your health care provider. Document Revised: 04/01/2019 Document Reviewed: 07/25/2017 Elsevier Patient Education  [place].  Acute Back Pain, Adult Acute back pain is sudden and usually short-lived. It is often caused by an injury to the muscles and tissues in the back. The injury may result from:  A muscle or ligament getting overstretched or torn (strained). Ligaments are tissues that connect bones to each other. Lifting something  improperly can cause a back strain.  Wear and tear (degeneration) of the spinal disks. Spinal disks are circular tissue that provides cushioning between the bones of the spine (vertebrae).  Twisting motions, such as while playing sports or doing yard work.  A hit to the back.  Arthritis. You may have a physical exam, lab tests, and imaging tests to find the cause of your pain. Acute back pain usually goes away with rest and home care. Follow these instructions at home: Managing pain, stiffness, and swelling  Take over-the-counter and prescription medicines only as told by your health care provider.  Your health care provider may recommend applying ice during the first 24-48 hours after your pain starts. To do this: ? Put ice in a plastic bag. ? Place a towel between your skin and the bag. ? Leave the ice on for 20 minutes, 2-3 times a day.  If directed, apply heat to the affected area  as often as told by your health care provider. Use the heat source that your health care provider recommends, such as a moist heat pack or a heating pad. ? Place a towel between your skin and the heat source. ? Leave the heat on for 20-30 minutes. ? Remove the heat if your skin turns bright red. This is especially important if you are unable to feel pain, heat, or cold. You have a greater risk of getting burned. Activity   Do not stay in bed. Staying in bed for more than 1-2 days can delay your recovery.  Sit up and stand up straight. Avoid leaning forward when you sit, or hunching over when you stand. ? If you work at a desk, sit close to it so you do not need to lean over. Keep your chin tucked in. Keep your neck drawn back, and keep your elbows bent at a right angle. Your arms should look like the letter "L." ? Sit high and close to the steering wheel when you drive. Add lower back (lumbar) support to your car seat, if needed.  Take short walks on even surfaces as soon as you are able. Try to  increase the length of time you walk each day.  Do not sit, drive, or stand in one place for more than 30 minutes at a time. Sitting or standing for long periods of time can put stress on your back.  Do not drive or use heavy machinery while taking prescription pain medicine.  Use proper lifting techniques. When you bend and lift, use positions that put less stress on your back: ? [place] your knees. ? Keep the load close to your body. ? Avoid twisting.  Exercise regularly as told by your health care provider. Exercising helps your back heal faster and helps prevent back injuries by keeping muscles strong and flexible.  Work with a physical therapist to make a safe exercise program, as recommended by your health care provider. Do any exercises as told by your physical therapist. Lifestyle  Maintain a healthy weight. Extra weight puts stress on your back and makes it difficult to have good posture.  Avoid activities or situations that make you feel anxious or stressed. Stress and anxiety increase muscle tension and can make back pain worse. Learn ways to manage anxiety and stress, such as through exercise. General instructions  Sleep on a firm mattress in a comfortable position. Try lying on your side with your knees slightly bent. If you lie on your back, put a pillow under your knees.  Follow your treatment plan as told by your health care provider. This may include: ? Cognitive or behavioral therapy. ? Acupuncture or massage therapy. ? Meditation or yoga. Contact a health care provider if:  You have pain that is not relieved with rest or medicine.  You have increasing pain going down into your legs or buttocks.  Your pain does not improve after 2 weeks.  You have pain at night.  You lose weight without trying.  You have a fever or chills. Get help right away if:  You develop new bowel or bladder control problems.  You have unusual weakness or numbness in your arms or  legs.  You develop nausea or vomiting.  You develop abdominal pain.  You feel faint. Summary  Acute back pain is sudden and usually short-lived.  Use proper lifting techniques. When you bend and lift, use positions that put less stress on your back.  Take over-the-counter and prescription  medicines and apply heat or ice as directed by your health care provider. This information is not intended to replace advice given to you by your health care provider. Make sure you discuss any questions you have with your health care provider. Document Revised: 04/01/2019 Document Reviewed: 07/25/2017 Elsevier Patient Education  2020 [place] from Work, [employer], or Physical Activity _______________________________________________________ needs to be excused from: ____ Work. ____ School. ____ Physical activity. This is effective for the following dates: ______________________________________. He or she may return to work or school, but should avoid physical activity or other activities from now until _______________. Activity restrictions include: ____ Lifting more than _________ lb. ____ Sitting longer than __________ minutes at a time. ____ Standing longer than ________ minutes at a time. ____ Other activities including: ___________________________________________________________________________________ ____ He or she may return to full physical activity on ________________. Health care provider name (printed): _____________________________________________________ Health care provider (signature): _________________________________________________________ Date: ______________________________ This information is not intended to replace advice given to you by your health care provider. Make sure you discuss any questions you have with your health care provider. Document Revised: 12/06/2017 Document Reviewed: 12/06/2017 Elsevier Patient Education  2020 [employer].

## 2020-05-10 ENCOUNTER — Other Ambulatory Visit: Payer: Self-pay

## 2020-05-12 ENCOUNTER — Ambulatory Visit (INDEPENDENT_AMBULATORY_CARE_PROVIDER_SITE_OTHER): Payer: Managed Care, Other (non HMO) | Admitting: Internal Medicine

## 2020-05-12 ENCOUNTER — Other Ambulatory Visit: Payer: Self-pay

## 2020-05-12 ENCOUNTER — Encounter: Payer: Self-pay | Admitting: Internal Medicine

## 2020-05-12 VITALS — BP 110/80 | HR 84 | Temp 98.1°F | Resp 13 | Ht 68.0 in | Wt 179.0 lb

## 2020-05-12 DIAGNOSIS — Z636 Dependent relative needing care at home: Secondary | ICD-10-CM | POA: Insufficient documentation

## 2020-05-12 DIAGNOSIS — E785 Hyperlipidemia, unspecified: Secondary | ICD-10-CM

## 2020-05-12 DIAGNOSIS — Z8742 Personal history of other diseases of the female genital tract: Secondary | ICD-10-CM

## 2020-05-12 DIAGNOSIS — Z1231 Encounter for screening mammogram for malignant neoplasm of breast: Secondary | ICD-10-CM

## 2020-05-12 DIAGNOSIS — D126 Benign neoplasm of colon, unspecified: Secondary | ICD-10-CM

## 2020-05-12 DIAGNOSIS — R5383 Other fatigue: Secondary | ICD-10-CM

## 2020-05-12 DIAGNOSIS — Z0000 Encounter for general adult medical examination without abnormal findings: Secondary | ICD-10-CM

## 2020-05-12 LAB — CBC WITH DIFFERENTIAL/PLATELET
Basophils Absolute: 0 10*3/uL (ref 0.0–0.1)
Basophils Relative: 0.8 % (ref 0.0–3.0)
Eosinophils Absolute: 0.1 10*3/uL (ref 0.0–0.7)
Eosinophils Relative: 1.8 % (ref 0.0–5.0)
HCT: 40.7 % (ref 36.0–46.0)
Hemoglobin: 13.8 g/dL (ref 12.0–15.0)
Lymphocytes Relative: 32.8 % (ref 12.0–46.0)
Lymphs Abs: 1.6 10*3/uL (ref 0.7–4.0)
MCHC: 33.8 g/dL (ref 30.0–36.0)
MCV: 91.3 fl (ref 78.0–100.0)
Monocytes Absolute: 0.3 10*3/uL (ref 0.1–1.0)
Monocytes Relative: 5.6 % (ref 3.0–12.0)
Neutro Abs: 2.9 10*3/uL (ref 1.4–7.7)
Neutrophils Relative %: 59 % (ref 43.0–77.0)
Platelets: 291 10*3/uL (ref 150.0–400.0)
RBC: 4.46 Mil/uL (ref 3.87–5.11)
RDW: 12.7 % (ref 11.5–15.5)
WBC: 4.9 10*3/uL (ref 4.0–10.5)

## 2020-05-12 LAB — LIPID PANEL
Cholesterol: 211 mg/dL — ABNORMAL HIGH (ref 0–200)
HDL: 67.3 mg/dL (ref >39.00)
LDL Cholesterol: 130 mg/dL — ABNORMAL HIGH (ref 0–99)
NonHDL: 143.29
Total CHOL/HDL Ratio: 3
Triglycerides: 67 mg/dL (ref 0.0–149.0)
VLDL: 13.4 mg/dL (ref 0.0–40.0)

## 2020-05-12 LAB — COMPREHENSIVE METABOLIC PANEL
ALT: 11 U/L (ref 0–35)
AST: 14 U/L (ref 0–37)
Albumin: 4.3 g/dL (ref 3.5–5.2)
Alkaline Phosphatase: 106 U/L (ref 39–117)
BUN: 12 mg/dL (ref 6–23)
CO2: 29 mEq/L (ref 19–32)
Calcium: 9.6 mg/dL (ref 8.4–10.5)
Chloride: 103 mEq/L (ref 96–112)
Creatinine, Ser: 0.75 mg/dL (ref 0.40–1.20)
GFR: 78.86 mL/min (ref >60.00)
Glucose, Bld: 98 mg/dL (ref 70–99)
Potassium: 4.5 mEq/L (ref 3.5–5.1)
Sodium: 138 mEq/L (ref 135–145)
Total Bilirubin: 0.5 mg/dL (ref 0.2–1.2)
Total Protein: 7 g/dL (ref 6.0–8.3)

## 2020-05-12 LAB — TSH: TSH: 1.07 u[IU]/mL (ref 0.35–4.50)

## 2020-05-12 NOTE — Assessment & Plan Note (Signed)

## 2020-05-12 NOTE — Assessment & Plan Note (Signed)
Maintains excellent care of her elderly mother who lives with her.  Discussed long term potential needs of mother

## 2020-05-12 NOTE — Patient Instructions (Signed)
I have ordered the referral for colonoscopy and mammogram  You are due after June 1 for the mammogram  PAP smear due on or after March 2022  DEXA scan at age 59   Health Maintenance for Postmenopausal Women Menopause is a normal process in which your ability to get pregnant comes to an end. This process happens slowly over many months or years, usually between the ages of 1 and 44. Menopause is complete when you have missed your menstrual periods for 12 months. It is important to talk with your health care provider about some of the most common conditions that affect women after menopause (postmenopausal women). These include heart disease, cancer, and bone loss (osteoporosis). Adopting a healthy lifestyle and getting preventive care can help to promote your health and wellness. The actions you take can also lower your chances of developing some of these common conditions. What should I know about menopause? During menopause, you may get a number of symptoms, such as:  Hot flashes. These can be moderate or severe.  Night sweats.  Decrease in sex drive.  Mood swings.  Headaches.  Tiredness.  Irritability.  Memory problems.  Insomnia. Choosing to treat or not to treat these symptoms is a decision that you make with your health care provider. Do I need hormone replacement therapy?  Hormone replacement therapy is effective in treating symptoms that are caused by menopause, such as hot flashes and night sweats.  Hormone replacement carries certain risks, especially as you become older. If you are thinking about using estrogen or estrogen with progestin, discuss the benefits and risks with your health care provider. What is my risk for heart disease and stroke? The risk of heart disease, heart attack, and stroke increases as you age. One of the causes may be a change in the body's hormones during menopause. This can affect how your body uses dietary fats, triglycerides, and  cholesterol. Heart attack and stroke are medical emergencies. There are many things that you can do to help prevent heart disease and stroke. Watch your blood pressure  High blood pressure causes heart disease and increases the risk of stroke. This is more likely to develop in people who have high blood pressure readings, are of African descent, or are overweight.  Have your blood pressure checked: ? Every 3-5 years if you are 57-60 years of age. ? Every year if you are 59 years old or older. Eat a healthy diet   Eat a diet that includes plenty of vegetables, fruits, low-fat dairy products, and lean protein.  Do not eat a lot of foods that are high in solid fats, added sugars, or sodium. Get regular exercise Get regular exercise. This is one of the most important things you can do for your health. Most adults should:  Try to exercise for at least 150 minutes each week. The exercise should increase your heart rate and make you sweat (moderate-intensity exercise).  Try to do strengthening exercises at least twice each week. Do these in addition to the moderate-intensity exercise.  Spend less time sitting. Even light physical activity can be beneficial. Other tips  Work with your health care provider to achieve or maintain a healthy weight.  Do not use any products that contain nicotine or tobacco, such as cigarettes, e-cigarettes, and chewing tobacco. If you need help quitting, ask your health care provider.  Know your numbers. Ask your health care provider to check your cholesterol and your blood sugar (glucose). Continue to have your blood  tested as directed by your health care provider. Do I need screening for cancer? Depending on your health history and family history, you may need to have cancer screening at different stages of your life. This may include screening for:  Breast cancer.  Cervical cancer.  Lung cancer.  Colorectal cancer. What is my risk for osteoporosis?  After menopause, you may be at increased risk for osteoporosis. Osteoporosis is a condition in which bone destruction happens more quickly than new bone creation. To help prevent osteoporosis or the bone fractures that can happen because of osteoporosis, you may take the following actions:  If you are 81-27 years old, get at least 1,000 mg of calcium and at least 600 mg of vitamin D per day.  If you are older than age 47 but younger than age 2, get at least 1,200 mg of calcium and at least 600 mg of vitamin D per day.  If you are older than age 37, get at least 1,200 mg of calcium and at least 800 mg of vitamin D per day. Smoking and drinking excessive alcohol increase the risk of osteoporosis. Eat foods that are rich in calcium and vitamin D, and do weight-bearing exercises several times each week as directed by your health care provider. How does menopause affect my mental health? Depression may occur at any age, but it is more common as you become older. Common symptoms of depression include:  Low or sad mood.  Changes in sleep patterns.  Changes in appetite or eating patterns.  Feeling an overall lack of motivation or enjoyment of activities that you previously enjoyed.  Frequent crying spells. Talk with your health care provider if you think that you are experiencing depression. General instructions See your health care provider for regular wellness exams and vaccines. This may include:  Scheduling regular health, dental, and eye exams.  Getting and maintaining your vaccines. These include: ? Influenza vaccine. Get this vaccine each year before the flu season begins. ? Pneumonia vaccine. ? Shingles vaccine. ? Tetanus, diphtheria, and pertussis (Tdap) booster vaccine. Your health care provider may also recommend other immunizations. Tell your health care provider if you have ever been abused or do not feel safe at home. Summary  Menopause is a normal process in which your  ability to get pregnant comes to an end.  This condition causes hot flashes, night sweats, decreased interest in sex, mood swings, headaches, or lack of sleep.  Treatment for this condition may include hormone replacement therapy.  Take actions to keep yourself healthy, including exercising regularly, eating a healthy diet, watching your weight, and checking your blood pressure and blood sugar levels.  Get screened for cancer and depression. Make sure that you are up to date with all your vaccines. This information is not intended to replace advice given to you by your health care provider. Make sure you discuss any questions you have with your health care provider. Document Revised: 12/04/2018 Document Reviewed: 12/04/2018 Elsevier Patient Education  2020 [employer].

## 2020-05-12 NOTE — Assessment & Plan Note (Signed)
She is overdue for 5 yr follow up ,  Referral to GI made

## 2020-05-12 NOTE — Progress Notes (Signed)
Patient ID: Maria [name], female    DOB: 07-Jul-1961  Age: 60 y.o. MRN: [id number]  The patient is here for annual preventive wellness examination and management of other chronic and acute problems.  This visit occurred during the SARS-CoV-2 public health emergency.  Safety protocols were in place, including screening questions prior to the visit, additional usage of staff PPE, and extensive cleaning of exam room while observing appropriate contact time as indicated for disinfecting solutions.      The risk factors are reflected in the social history.  The roster of all physicians providing medical care to patient - is listed in the Snapshot section of the chart.  Activities of daily living:  The patient is 100% independent in all ADLs: dressing, toileting, feeding as well as independent mobility  Home safety : The patient has smoke detectors in the home. They wear seatbelts.  There are no firearms at home. There is no violence in the home.   There is no risks for hepatitis, STDs or HIV. There is no   history of blood transfusion. They have no travel history to infectious disease endemic areas of the world.  The patient has seen their dentist in the last six month. They have seen their eye doctor in the last year. She denies hearing difficulty with regard to whispered voices and some television programs.  They have deferred audiologic testing in the last year.  She does not have excessive sun exposure. Discussed the need for sun protection: hats, long sleeves and use of sunscreen if there is significant sun exposure.  She has an annual dermatologic exam.   Diet: the importance of a healthy diet is discussed. They do have a healthy diet.  The benefits of regular aerobic exercise were discussed. She walks 4 times per week ,  30 minutes.   Depression screen: there are no signs or vegative symptoms of depression- irritability, change in appetite, anhedonia, sadness/tearfullness.  Cognitive  assessment: the patient manages all their financial and personal affairs and is actively engaged. They could relate day,date,year and events; recalled 2/3 objects at 3 minutes; performed clock-face test normally.  The following portions of the patient's history were reviewed and updated as appropriate: allergies, current medications, past family history, past medical history,  past surgical history, past social history  and problem list.  Visual acuity was not assessed per patient preference since she has regular follow up with her ophthalmologist. Hearing and body mass index were assessed and reviewed.   During the course of the visit the patient was educated and counseled about appropriate screening and preventive services including : fall prevention , diabetes screening, nutrition counseling, colorectal cancer screening, and recommended immunizations.    CC: The primary encounter diagnosis was Encounter for screening mammogram for malignant neoplasm of breast. Diagnoses of Tubular adenoma of colon, Fatigue, unspecified type, Hyperlipidemia LDL goal <160, History of abnormal cervical Papanicolaou smear, Visit for preventive health examination, and Caregiver burden were also pertinent to this visit.  History Maria [name] has a past medical history of Abnormal cervical Papanicolaou smear (1995), Family history of brain cancer, Family history of breast cancer, Family history of skin cancer, Greater trochanteric bursitis of left hip (09/15/2014), and Ovarian cyst (1993).   She has no past surgical history on file.   Her family history includes Brain cancer (age of onset: 68) in her maternal uncle; Breast cancer (age of onset: 56) in her cousin; Breast cancer (age of onset: 59) in her maternal aunt; Breast cancer (age  of onset: 50) in her mother; Cancer (age of onset: 61) in her maternal aunt; Clotting disorder in her father; Diabetes in her mother; Heart disease in her mother; Hyperlipidemia in her mother;  Multiple sclerosis in her paternal grandmother; Parkinson's disease in her brother; Skin cancer in her maternal grandfather; Stroke in her father.She reports that she has never smoked. She has never used smokeless tobacco. She reports current alcohol use of about 4.0 standard drinks of alcohol per week. She reports that she does not use drugs.  Outpatient Medications Prior to Visit  Medication Sig Dispense Refill  . Cholecalciferol (VITAMIN D-3) 5000 units TABS Take 1 tablet by mouth 3 (three) times a week.    . conjugated estrogens (PREMARIN) vaginal cream 1 applicator intravaginally at bedtime for 2 weeks, then twice weekly thereafter 42.5 g 12  . cyclobenzaprine (FLEXERIL) 5 MG tablet Take 1 tablet (5 mg total) by mouth 3 (three) times daily as needed for muscle spasms. 30 tablet 1  . ibuprofen (ADVIL) 600 MG tablet Take 1 tablet (600 mg total) by mouth every 6 (six) hours as needed. 30 tablet 0   No facility-administered medications prior to visit.    Review of Systems  Objective:  BP 110/80 (BP Location: Left Arm, Patient Position: Sitting, Cuff Size: Normal)   Pulse 84   Temp 98.1 F (36.7 C) (Temporal)   Resp 13   Ht 5\' 8"  (1.727 m)   Wt 179 lb (81.2 kg)   LMP 06/22/2013   SpO2 97%   BMI 27.22 kg/m   Physical Exam    Assessment & Plan:   Problem List Items Addressed This Visit      Unprioritized   Caregiver burden    Maintains excellent care of her elderly mother who lives with her.  Discussed long term potential needs of mother       History of abnormal cervical Papanicolaou smear   Tubular adenoma of colon    She is overdue for 5 yr follow up ,  Referral to GI made       Relevant Orders   Ambulatory referral to Gastroenterology   Visit for preventive health examination    age appropriate education and counseling updated, referrals for preventative services and immunizations addressed, dietary and smoking counseling addressed, most recent labs reviewed.  I have  personally reviewed and have noted:  1) the patient's medical and social history 2) The pt's use of alcohol, tobacco, and illicit drugs 3) The patient's current medications and supplements 4) Functional ability including ADL's, fall risk, home safety risk, hearing and visual impairment 5) Diet and physical activities 6) Evidence for depression or mood disorder 7) The patient's height, weight, and BMI have been recorded in the chart  I have made referrals, and provided counseling and education based on review of the above       Other Visit Diagnoses    Encounter for screening mammogram for malignant neoplasm of breast    -  Primary   Relevant Orders   MM 3D SCREEN BREAST BILATERAL   Fatigue, unspecified type       Relevant Orders   CBC with Differential/Platelet   TSH   Comprehensive metabolic panel   Hyperlipidemia LDL goal <160       Relevant Orders   Lipid panel      I am having Maria [name] maintain her Vitamin D-3, conjugated estrogens, ibuprofen, and cyclobenzaprine.  No orders of the defined types were placed in this encounter.  There are no discontinued medications.  Follow-up: No follow-ups on file.   Crecencio Mc, MD

## 2020-06-15 ENCOUNTER — Ambulatory Visit
Admission: RE | Admit: 2020-06-15 | Discharge: 2020-06-15 | Disposition: A | Discharge status: Home / Self Care | Payer: Managed Care, Other (non HMO) | Source: Ambulatory Visit | Attending: Internal Medicine | Admitting: Internal Medicine

## 2020-06-15 ENCOUNTER — Other Ambulatory Visit: Payer: Self-pay

## 2020-06-15 DIAGNOSIS — Z1231 Encounter for screening mammogram for malignant neoplasm of breast: Secondary | ICD-10-CM | POA: Insufficient documentation

## 2020-08-14 LAB — HM COLONOSCOPY

## 2020-12-21 ENCOUNTER — Other Ambulatory Visit: Payer: Self-pay | Admitting: Internal Medicine

## 2021-05-16 ENCOUNTER — Other Ambulatory Visit: Payer: Self-pay | Admitting: Internal Medicine

## 2021-06-14 ENCOUNTER — Other Ambulatory Visit (HOSPITAL_COMMUNITY)
Admission: RE | Admit: 2021-06-14 | Discharge: 2021-06-14 | Disposition: A | Discharge status: Home / Self Care | Payer: Managed Care, Other (non HMO) | Source: Ambulatory Visit | Attending: Internal Medicine | Admitting: Internal Medicine

## 2021-06-14 ENCOUNTER — Other Ambulatory Visit: Payer: Self-pay

## 2021-06-14 ENCOUNTER — Encounter: Payer: Self-pay | Admitting: Internal Medicine

## 2021-06-14 ENCOUNTER — Ambulatory Visit (INDEPENDENT_AMBULATORY_CARE_PROVIDER_SITE_OTHER): Payer: Managed Care, Other (non HMO) | Admitting: Internal Medicine

## 2021-06-14 VITALS — BP 106/78 | HR 71 | Temp 96.8°F | Resp 14 | Ht 68.0 in | Wt 178.4 lb

## 2021-06-14 DIAGNOSIS — Z124 Encounter for screening for malignant neoplasm of cervix: Secondary | ICD-10-CM | POA: Diagnosis not present

## 2021-06-14 DIAGNOSIS — E559 Vitamin D deficiency, unspecified: Secondary | ICD-10-CM

## 2021-06-14 DIAGNOSIS — Z23 Encounter for immunization: Secondary | ICD-10-CM | POA: Diagnosis not present

## 2021-06-14 DIAGNOSIS — Z1231 Encounter for screening mammogram for malignant neoplasm of breast: Secondary | ICD-10-CM

## 2021-06-14 DIAGNOSIS — Z0000 Encounter for general adult medical examination without abnormal findings: Secondary | ICD-10-CM

## 2021-06-14 DIAGNOSIS — E782 Mixed hyperlipidemia: Secondary | ICD-10-CM | POA: Diagnosis not present

## 2021-06-14 NOTE — Progress Notes (Signed)
[name] ID: Maria [name], female    DOB: 1961-12-12  Age: 60 y.o. MRN: [id number]  Maria [name] is here for annual preventive examination and management of other chronic and acute problems.   Maria risk factors are reflected in Maria social history.  Maria roster of all physicians providing medical care to [name] - is listed in Maria Snapshot section of Maria chart.  Activities of daily living:  Maria [name] is 100% independent in all ADLs: dressing, toileting, feeding as well as independent mobility  Home safety : Maria [name] has smoke detectors in Maria home. They wear seatbelts.  There are no firearms at home. There is no violence in Maria home.   There is no risks for hepatitis, STDs or HIV. There is no   history of blood transfusion. They have no travel history to infectious disease endemic areas of [place] world.  Maria [name] has seen their dentist in [place] last six month. They have seen their eye doctor in [place] last year. Maria [name] denies hearing difficulty with regard to whispered voices and some television programs.  They have deferred audiologic testing in [place] last year.  They do not  have excessive sun exposure. Discussed Maria need for sun protection: hats, long sleeves and use of sunscreen if there is significant sun exposure.   Diet: Maria importance of a healthy diet is discussed. They do have a healthy diet.  Maria benefits of regular aerobic exercise were discussed. Maria [name] walks 4 times per week ,  30 minutes.   Depression screen: there are no signs or vegative symptoms of depression- irritability, change in appetite, anhedonia, sadness/tearfullness.  Cognitive assessment: Maria [name] manages all their financial and personal affairs and is actively engaged. They could relate day,date,year and events; recalled 2/3 objects at 3 minutes; performed clock-face test normally.  Maria following portions of Maria [name]'s history were reviewed and updated as appropriate: allergies, current medications, past family  history, past medical history,  past surgical history, past social history  and problem list.  Visual acuity was not assessed per [name] preference since Maria [name] has regular follow up with Maria [name] ophthalmologist. Hearing and body mass index were assessed and reviewed.   During Maria course of Maria visit Maria [name] was educated and counseled about appropriate screening and preventive services including : fall prevention , diabetes screening, nutrition counseling, colorectal cancer screening, and recommended immunizations.    CC: Maria primary encounter diagnosis was Vitamin D deficiency. 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  History Maria [name] has a past medical history of Abnormal cervical Papanicolaou smear (1995), Family history of brain cancer, Family history of breast cancer, Family history of skin cancer, Greater trochanteric bursitis of left hip (09/15/2014), and Ovarian cyst (1993).   Maria [name] has no past surgical history on file.   Maria [name] family history includes Brain cancer (age of onset: 44) in Maria [name] maternal uncle; Breast cancer (age of onset: 20) in Maria [name] cousin; Breast cancer (age of onset: 19) in Maria [name] maternal aunt; Breast cancer (age of onset: 13) in Maria [name] mother; Cancer (age of onset: 18) in Maria [name] maternal aunt; Clotting disorder in Maria [name] father; Diabetes in Maria [name] mother; Heart disease in Maria [name] mother; Hyperlipidemia in Maria [name] mother; Multiple sclerosis in Maria [name] paternal grandmother; Parkinson's disease in Maria [name] brother; Skin cancer in Maria [name] maternal grandfather; Stroke in Maria [name] father.Maria [name] reports that  Maria [name] has never smoked. Maria [name] has never used smokeless tobacco. Maria [name] reports current alcohol use of about 4.0 standard drinks of alcohol per week. Maria [name] reports that Maria [name] does not use drugs.  Outpatient  Medications Prior to Visit  Medication Sig Dispense Refill   cholecalciferol (VITAMIN D3) 25 MCG (1000 UNIT) tablet Take 1,000 Units by mouth daily.     ibuprofen (ADVIL) 600 MG tablet Take 1 tablet (600 mg total) by mouth every 6 (six) hours as needed. 30 tablet 0   PREMARIN vaginal cream INSERT APPLICATORFUL VAGINALLY AT BEDTIME FOR 2 WEEKS THEN TWICE WEEKLY THEREAFTER 30 g 0   Cholecalciferol (VITAMIN D-3) 5000 units TABS Take 1 tablet by mouth 3 (three) times a week. ([name] not taking: Reported on 06/14/2021)     cyclobenzaprine (FLEXERIL) 5 MG tablet Take 1 tablet (5 mg total) by mouth 3 (three) times daily as needed for muscle spasms. ([name] not taking: Reported on 06/14/2021) 30 tablet 1   No facility-administered medications prior to visit.    Review of Systems  [name] denies headache, fevers, malaise, unintentional weight loss, skin rash, eye pain, sinus congestion and sinus pain, sore throat, dysphagia,  hemoptysis , cough, dyspnea, wheezing, chest pain, palpitations, orthopnea, edema, abdominal pain, nausea, melena, diarrhea, constipation, flank pain, dysuria, hematuria, urinary  Frequency, nocturia, numbness, tingling, seizures,  Focal weakness, Loss of consciousness,  Tremor, insomnia, depression, anxiety, and suicidal ideation.    Objective:  BP 106/78 (BP Location: Left Arm, [name] Position: Sitting, Cuff Size: Normal)   Pulse 71   Temp (!) 96.8 F (36 C) (Temporal)   Resp 14   Ht 5\' 8"  (1.727 m)   Wt 178 lb 6.4 oz (80.9 kg)   LMP 06/22/2013   SpO2 96%   BMI 27.13 kg/m   Physical Exam  General Appearance:    Alert, cooperative, no distress, appears stated age  Head:    Normocephalic, without obvious abnormality, atraumatic  Eyes:    PERRL, conjunctiva/corneas clear, EOM's intact, fundi    benign, both eyes  Ears:    Normal TM's and external ear canals, both ears  Nose:   Nares normal, septum midline, mucosa normal, no drainage    or sinus tenderness  Throat:    Lips, mucosa, and tongue normal; teeth and gums normal  Neck:   Supple, symmetrical, trachea midline, no adenopathy;    thyroid:  no enlargement/tenderness/nodules; no carotid   bruit or JVD  Back:     Symmetric, no curvature, ROM normal, no CVA tenderness  Lungs:     Clear to auscultation bilaterally, respirations unlabored  Chest Wall:    No tenderness or deformity   Heart:    Regular rate and rhythm, S1 and S2 normal, no murmur, rub   or gallop  Breast Exam:    No tenderness, masses, or nipple abnormality  Abdomen:     Soft, non-tender, bowel sounds active all four quadrants,    no masses, no organomegaly  Genitalia:    Pelvic: cervix normal in appearance, external genitalia normal, no adnexal masses or tenderness, no cervical motion tenderness, rectovaginal septum normal, uterus normal size, shape, and consistency and vagina normal without discharge  Extremities:   Extremities normal, atraumatic, no cyanosis or edema  Pulses:   2+ and symmetric all extremities  Skin:   Skin color, texture, turgor normal, no rashes or lesions  Lymph nodes:   Cervical, supraclavicular, and axillary nodes normal  Neurologic:   CNII-XII intact, normal strength, sensation and reflexes  throughout     Assessment & Plan:   Problem List Items Addressed This Visit       Unprioritized   Visit for preventive health examination    age appropriate education and counseling updated, referrals for preventative services and immunizations addressed, dietary and smoking counseling addressed, most recent labs reviewed.  I have personally reviewed and have noted:   1) Maria [name]'s medical and social history 2) Maria pt's use of alcohol, tobacco, and illicit drugs 3) Maria [name]'s current medications and supplements 4) Functional ability including ADL's, fall risk, home safety risk, hearing and visual impairment 5) Diet and physical activities 6) Evidence for depression or mood disorder 7) Maria [name]'s height,  weight, and BMI have been recorded in Maria chart   I have made referrals, and provided counseling and education based on review of Maria above       Vitamin D deficiency - Primary   Relevant Orders   VITAMIN D 25 Hydroxy (Vit-D Deficiency, Fractures) (Completed)   Other Visit Diagnoses     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 Mcelvain's Vitamin D-3 and cyclobenzaprine. I am also having Maria [name] maintain Maria [name] ibuprofen, Premarin, and cholecalciferol.  No orders of Maria defined types were placed in this encounter.   Medications Discontinued During This Encounter  Medication Reason   cyclobenzaprine (FLEXERIL) 5 MG tablet    Cholecalciferol (VITAMIN D-3) 5000 units TABS Change in therapy    Follow-up: Return in about 1 year (around 06/14/2022).   Crecencio Mc, MD

## 2021-06-14 NOTE — Patient Instructions (Signed)

## 2021-06-15 LAB — COMPREHENSIVE METABOLIC PANEL
ALT: 11 U/L (ref 0–35)
AST: 14 U/L (ref 0–37)
Albumin: 4.3 g/dL (ref 3.5–5.2)
Alkaline Phosphatase: 103 U/L (ref 39–117)
BUN: 12 mg/dL (ref 6–23)
CO2: 30 mEq/L (ref 19–32)
Calcium: 9.8 mg/dL (ref 8.4–10.5)
Chloride: 102 mEq/L (ref 96–112)
Creatinine, Ser: 0.75 mg/dL (ref 0.40–1.20)
GFR: 86.21 mL/min (ref >60.00)
Glucose, Bld: 91 mg/dL (ref 70–99)
Potassium: 4.1 mEq/L (ref 3.5–5.1)
Sodium: 138 mEq/L (ref 135–145)
Total Bilirubin: 0.5 mg/dL (ref 0.2–1.2)
Total Protein: 7.1 g/dL (ref 6.0–8.3)

## 2021-06-15 LAB — LIPID PANEL
Cholesterol: 207 mg/dL — ABNORMAL HIGH (ref 0–200)
HDL: 64.4 mg/dL (ref >39.00)
LDL Cholesterol: 118 mg/dL — ABNORMAL HIGH (ref 0–99)
NonHDL: 142.47
Total CHOL/HDL Ratio: 3
Triglycerides: 122 mg/dL (ref 0.0–149.0)
VLDL: 24.4 mg/dL (ref 0.0–40.0)

## 2021-06-15 LAB — TSH: TSH: 1.01 u[IU]/mL (ref 0.35–4.50)

## 2021-06-15 LAB — VITAMIN D 25 HYDROXY (VIT D DEFICIENCY, FRACTURES): VITD: 28.83 ng/mL — ABNORMAL LOW (ref 30.00–100.00)

## 2021-06-15 NOTE — Assessment & Plan Note (Signed)

## 2021-06-16 LAB — CYTOLOGY - PAP
Adequacy: ABSENT
Comment: NEGATIVE
Diagnosis: NEGATIVE
High risk HPV: NEGATIVE

## 2021-07-06 ENCOUNTER — Other Ambulatory Visit: Payer: Self-pay

## 2021-07-06 ENCOUNTER — Ambulatory Visit
Admission: RE | Admit: 2021-07-06 | Discharge: 2021-07-06 | Disposition: A | Discharge status: Home / Self Care | Payer: Managed Care, Other (non HMO) | Source: Ambulatory Visit | Attending: Internal Medicine | Admitting: Internal Medicine

## 2021-07-06 DIAGNOSIS — Z1231 Encounter for screening mammogram for malignant neoplasm of breast: Secondary | ICD-10-CM | POA: Diagnosis not present

## 2021-10-05 ENCOUNTER — Other Ambulatory Visit: Payer: Self-pay | Admitting: Internal Medicine

## 2021-10-31 NOTE — Telephone Encounter (Signed)
You do not need  to be seen if al f your symptoms rae improving.

## 2022-02-26 ENCOUNTER — Other Ambulatory Visit: Payer: Self-pay | Admitting: Internal Medicine

## 2022-06-14 ENCOUNTER — Encounter: Payer: Self-pay | Admitting: Internal Medicine

## 2022-06-15 ENCOUNTER — Ambulatory Visit (INDEPENDENT_AMBULATORY_CARE_PROVIDER_SITE_OTHER): Payer: Managed Care, Other (non HMO) | Admitting: Internal Medicine

## 2022-06-15 ENCOUNTER — Encounter: Payer: Self-pay | Admitting: Internal Medicine

## 2022-06-15 VITALS — BP 104/78 | HR 74 | Temp 98.1°F | Ht 68.0 in | Wt 176.8 lb

## 2022-06-15 DIAGNOSIS — E782 Mixed hyperlipidemia: Secondary | ICD-10-CM

## 2022-06-15 DIAGNOSIS — Z0000 Encounter for general adult medical examination without abnormal findings: Secondary | ICD-10-CM | POA: Diagnosis not present

## 2022-06-15 DIAGNOSIS — D126 Benign neoplasm of colon, unspecified: Secondary | ICD-10-CM

## 2022-06-15 DIAGNOSIS — I10 Essential (primary) hypertension: Secondary | ICD-10-CM | POA: Diagnosis not present

## 2022-06-15 DIAGNOSIS — Z1231 Encounter for screening mammogram for malignant neoplasm of breast: Secondary | ICD-10-CM

## 2022-06-15 DIAGNOSIS — R7301 Impaired fasting glucose: Secondary | ICD-10-CM | POA: Diagnosis not present

## 2022-06-15 DIAGNOSIS — N83201 Unspecified ovarian cyst, right side: Secondary | ICD-10-CM

## 2022-06-15 DIAGNOSIS — G521 Disorders of glossopharyngeal nerve: Secondary | ICD-10-CM

## 2022-06-15 DIAGNOSIS — Z79899 Other long term (current) drug therapy: Secondary | ICD-10-CM

## 2022-06-15 LAB — COMPREHENSIVE METABOLIC PANEL
ALT: 16 U/L (ref 0–35)
AST: 17 U/L (ref 0–37)
Albumin: 4.2 g/dL (ref 3.5–5.2)
Alkaline Phosphatase: 105 U/L (ref 39–117)
BUN: 14 mg/dL (ref 6–23)
CO2: 30 mEq/L (ref 19–32)
Calcium: 9.5 mg/dL (ref 8.4–10.5)
Chloride: 99 mEq/L (ref 96–112)
Creatinine, Ser: 0.75 mg/dL (ref 0.40–1.20)
GFR: 85.6 mL/min (ref >60.00)
Glucose, Bld: 84 mg/dL (ref 70–99)
Potassium: 4.4 mEq/L (ref 3.5–5.1)
Sodium: 136 mEq/L (ref 135–145)
Total Bilirubin: 0.5 mg/dL (ref 0.2–1.2)
Total Protein: 7.2 g/dL (ref 6.0–8.3)

## 2022-06-15 LAB — CBC WITH DIFFERENTIAL/PLATELET
Basophils Absolute: 0 10*3/uL (ref 0.0–0.1)
Basophils Relative: 0.5 % (ref 0.0–3.0)
Eosinophils Absolute: 0.1 10*3/uL (ref 0.0–0.7)
Eosinophils Relative: 1.3 % (ref 0.0–5.0)
HCT: 42.9 % (ref 36.0–46.0)
Hemoglobin: 14.1 g/dL (ref 12.0–15.0)
Lymphocytes Relative: 35.4 % (ref 12.0–46.0)
Lymphs Abs: 1.7 10*3/uL (ref 0.7–4.0)
MCHC: 32.9 g/dL (ref 30.0–36.0)
MCV: 93.8 fl (ref 78.0–100.0)
Monocytes Absolute: 0.3 10*3/uL (ref 0.1–1.0)
Monocytes Relative: 5.3 % (ref 3.0–12.0)
Neutro Abs: 2.7 10*3/uL (ref 1.4–7.7)
Neutrophils Relative %: 57.5 % (ref 43.0–77.0)
Platelets: 342 10*3/uL (ref 150.0–400.0)
RBC: 4.57 Mil/uL (ref 3.87–5.11)
RDW: 13.3 % (ref 11.5–15.5)
WBC: 4.7 10*3/uL (ref 4.0–10.5)

## 2022-06-15 LAB — LIPID PANEL
Cholesterol: 212 mg/dL — ABNORMAL HIGH (ref 0–200)
HDL: 72.1 mg/dL (ref >39.00)
LDL Cholesterol: 126 mg/dL — ABNORMAL HIGH (ref 0–99)
NonHDL: 140.03
Total CHOL/HDL Ratio: 3
Triglycerides: 72 mg/dL (ref 0.0–149.0)
VLDL: 14.4 mg/dL (ref 0.0–40.0)

## 2022-06-15 LAB — MICROALBUMIN / CREATININE URINE RATIO
Creatinine,U: 108.5 mg/dL
Microalb Creat Ratio: 0.6 mg/g (ref 0.0–30.0)
Microalb, Ur: <0.7 mg/dL (ref 0.0–1.9)

## 2022-06-15 LAB — HEMOGLOBIN A1C: Hgb A1c MFr Bld: 5.7 % (ref 4.6–6.5)

## 2022-06-15 LAB — LDL CHOLESTEROL, DIRECT: Direct LDL: 125 mg/dL

## 2022-06-15 LAB — TSH: TSH: 1.37 u[IU]/mL (ref 0.35–5.50)

## 2022-06-15 NOTE — Assessment & Plan Note (Signed)
Symptoms are rare/

## 2022-06-15 NOTE — Progress Notes (Unsigned)
The patient is here for annual preventive examination and management of other chronic and acute problems.   The risk factors are reflected in the social history.   The roster of all physicians providing medical care to patient - is listed in the Snapshot section of the chart.   Activities of daily living:  The patient is 100% independent in all ADLs: dressing, toileting, feeding as well as independent mobility   Home safety : The patient has smoke detectors in the home. They wear seatbelts.  There are no unsecured firearms at home. There is no violence in the home.    There is no risks for hepatitis, STDs or HIV. There is no   history of blood transfusion. They have no travel history to infectious disease endemic areas of the world.   The patient has seen their dentist in the last six month. They have seen their eye doctor in the last year. The patinet  denies slight hearing difficulty with regard to whispered voices and some television programs.  They have deferred audiologic testing in the last year.  They do not  have excessive sun exposure. Discussed the need for sun protection: hats, long sleeves and use of sunscreen if there is significant sun exposure.    Diet: the importance of a healthy diet is discussed. They do have a healthy diet.   The benefits of regular aerobic exercise were discussed. The patient  exercises  3 to 5 days per week  for  60 minutes.    Depression screen: there are no signs or vegative symptoms of depression- irritability, change in appetite, anhedonia, sadness/tearfullness.   The following portions of the patient's history were reviewed and updated as appropriate: allergies, current medications, past family history, past medical history,  past surgical history, past social history  and problem list.   Visual acuity was not assessed per patient preference since the patient has regular follow up with an  ophthalmologist. Hearing and body mass index were assessed and  reviewed.    During the course of the visit the patient was educated and counseled about appropriate screening and preventive services including : fall prevention , diabetes screening, nutrition counseling, colorectal cancer screening, and recommended immunizations.    Chief Complaint:  HPI     Annual Exam    Additional comments: Physical       Last edited by Adair Laundry, [place] on 06/14/2022  5:27 PM.        Review of Symptoms  Patient denies headache, fevers, malaise, unintentional weight loss, skin rash, eye pain, sinus congestion and sinus pain, sore throat, dysphagia,  hemoptysis , cough, dyspnea, wheezing, chest pain, palpitations, orthopnea, edema, abdominal pain, nausea, melena, diarrhea, constipation, flank pain, dysuria, hematuria, urinary  Frequency, nocturia, numbness, tingling, seizures,  Focal weakness, Loss of consciousness,  Tremor, insomnia, depression, anxiety, and suicidal ideation.    Physical Exam:  BP 104/78 (BP Location: Left Arm, Patient Position: Sitting, Cuff Size: Normal)   Pulse 74   Temp 98.1 F (36.7 C) (Oral)   Ht '5\' 8"'$  (1.727 m)   Wt 176 lb 12.8 oz (80.2 kg)   LMP 06/22/2013   SpO2 95%   BMI 26.88 kg/m    General appearance: alert, cooperative and appears stated age Head: Normocephalic, without obvious abnormality, atraumatic Eyes: conjunctivae/corneas clear. PERRL, EOM's intact. Fundi benign. Ears: normal TM's and external ear canals both ears Nose: Nares normal. Septum midline. Mucosa normal. No drainage or sinus tenderness. Throat: lips, mucosa, and tongue normal;  teeth and gums normal Neck: no adenopathy, no carotid bruit, no JVD, supple, symmetrical, trachea midline and thyroid not enlarged, symmetric, no tenderness/mass/nodules Lungs: clear to auscultation bilaterally Breasts: normal appearance, no masses or tenderness Heart: regular rate and rhythm, S1, S2 normal, no murmur, click, rub or gallop Abdomen: soft, non-tender; bowel  sounds normal; no masses,  no organomegaly Extremities: extremities normal, atraumatic, no cyanosis or edema Pulses: 2+ and symmetric Skin: Skin color, texture, turgor normal. No rashes or lesions Neurologic: Alert and oriented X 3, normal strength and tone. Normal symmetric reflexes. Normal coordination and gait.     Assessment and Plan:  No problem-specific Assessment & Plan notes found for this encounter.   Updated Medication List Outpatient Encounter Medications as of 06/15/2022  Medication Sig   cholecalciferol (VITAMIN D3) 25 MCG (1000 UNIT) tablet Take 1,000 Units by mouth daily.   ibuprofen (ADVIL) 600 MG tablet Take 1 tablet (600 mg total) by mouth every 6 (six) hours as needed.   PREMARIN vaginal cream INSERT 1 APPLICATORFUL VAGINALLY AT BEDTIME FOR 2 WEEKS THEN TWICE WEEKLY THERE AFTER   No facility-administered encounter medications on file as of 06/15/2022.

## 2022-06-15 NOTE — Assessment & Plan Note (Signed)

## 2022-07-20 ENCOUNTER — Ambulatory Visit
Admission: RE | Admit: 2022-07-20 | Discharge: 2022-07-20 | Disposition: A | Discharge status: Home / Self Care | Payer: Managed Care, Other (non HMO) | Source: Ambulatory Visit | Attending: Internal Medicine | Admitting: Internal Medicine

## 2022-07-20 DIAGNOSIS — Z1231 Encounter for screening mammogram for malignant neoplasm of breast: Secondary | ICD-10-CM | POA: Insufficient documentation

## 2022-07-21 ENCOUNTER — Other Ambulatory Visit: Payer: Self-pay | Admitting: Internal Medicine

## 2022-07-21 DIAGNOSIS — R928 Other abnormal and inconclusive findings on diagnostic imaging of breast: Secondary | ICD-10-CM

## 2022-07-21 DIAGNOSIS — N6489 Other specified disorders of breast: Secondary | ICD-10-CM

## 2022-07-26 ENCOUNTER — Ambulatory Visit
Admission: RE | Admit: 2022-07-26 | Discharge: 2022-07-26 | Disposition: A | Discharge status: Home / Self Care | Payer: Managed Care, Other (non HMO) | Source: Ambulatory Visit | Attending: Internal Medicine | Admitting: Internal Medicine

## 2022-07-26 DIAGNOSIS — N6489 Other specified disorders of breast: Secondary | ICD-10-CM

## 2022-07-26 DIAGNOSIS — R928 Other abnormal and inconclusive findings on diagnostic imaging of breast: Secondary | ICD-10-CM | POA: Insufficient documentation

## 2022-07-27 ENCOUNTER — Other Ambulatory Visit: Payer: Self-pay | Admitting: Internal Medicine

## 2022-07-27 DIAGNOSIS — R928 Other abnormal and inconclusive findings on diagnostic imaging of breast: Secondary | ICD-10-CM

## 2022-07-27 DIAGNOSIS — N63 Unspecified lump in unspecified breast: Secondary | ICD-10-CM

## 2022-08-15 ENCOUNTER — Other Ambulatory Visit: Payer: Self-pay | Admitting: Internal Medicine

## 2022-08-15 ENCOUNTER — Ambulatory Visit
Admission: RE | Admit: 2022-08-15 | Discharge: 2022-08-15 | Disposition: A | Discharge status: Home / Self Care | Payer: Managed Care, Other (non HMO) | Source: Ambulatory Visit | Attending: Internal Medicine | Admitting: Internal Medicine

## 2022-08-15 DIAGNOSIS — R928 Other abnormal and inconclusive findings on diagnostic imaging of breast: Secondary | ICD-10-CM

## 2022-08-15 DIAGNOSIS — N6489 Other specified disorders of breast: Secondary | ICD-10-CM | POA: Insufficient documentation

## 2022-08-15 DIAGNOSIS — N63 Unspecified lump in unspecified breast: Secondary | ICD-10-CM

## 2022-08-15 HISTORY — PX: BREAST BIOPSY: SHX20

## 2022-08-16 DIAGNOSIS — N6489 Other specified disorders of breast: Secondary | ICD-10-CM | POA: Insufficient documentation

## 2022-08-16 LAB — SURGICAL PATHOLOGY

## 2022-08-16 NOTE — Assessment & Plan Note (Addendum)
Right breast By biopsy August 2023 .  Will recommend referral to breast surgeon for complete excision

## 2022-08-17 ENCOUNTER — Encounter: Payer: Self-pay | Admitting: Internal Medicine

## 2022-08-17 ENCOUNTER — Encounter: Payer: Self-pay | Admitting: *Deleted

## 2022-08-17 NOTE — Progress Notes (Signed)
Referral recieved from [facility] for radial scar.  Appointment scheduled for surgical consultation with Dr. Bary Castilla on 08/13/22, per patient request.   No further needs at this time.

## 2022-08-19 ENCOUNTER — Other Ambulatory Visit: Payer: Self-pay | Admitting: Internal Medicine

## 2022-08-19 DIAGNOSIS — N6489 Other specified disorders of breast: Secondary | ICD-10-CM

## 2022-08-22 ENCOUNTER — Other Ambulatory Visit: Payer: Self-pay | Admitting: General Surgery

## 2022-08-22 DIAGNOSIS — N6091 Unspecified benign mammary dysplasia of right breast: Secondary | ICD-10-CM

## 2022-08-22 NOTE — Progress Notes (Signed)
Progress Notes - documented in this encounter Maria [name], Geronimo Boot, MD - 08/22/2022 10:00 AM EDT Formatting of this note is different from the original. Subjective:   Patient ID: Maria [name] is a 61 y.o. female.  HPI  The following portions of the patient's history were reviewed and updated as appropriate.  This a new patient is here today for: office visit. Here for evaluation of a right breast radial scar referred by Dr Derrel Nip. She states the area was found on routine mammogram, mild bruising from biopsy. Bra Size is a 38 B.  She is here with her husband, Maria [name].  Review of Systems  Constitutional: Negative for chills and fever.  Respiratory: Negative for cough.   Chief Complaint  Patient presents with  New Patient    BP 116/80  Pulse 73  Temp 36.7 C (98.1 F)  Ht 172.7 cm ('5\' 8"'$ )  Wt 82.1 kg (181 lb)  SpO2 98%  BMI 27.52 kg/m   Past Medical History:  Diagnosis Date  Abnormal cervical Papanicolaou smear 1995  s/p LEEP  Colon polyp  Greater trochanteric bursitis of left hip 09/15/2014  Ovarian cyst 1993    Past Surgical History:  Procedure Laterality Date  COLONOSCOPY 2021  Normal colon/PHx CP/Repeat 5 to 18yr/[facility]  INCISIONAL BIOPSY BREAST Right 08/15/2022  ultrasound guided core biopsy  BIOPSY BREAST W/ LOC DEVICE PLACEMENT AND STEREOTACTIC GUIDANCE Right 08/15/2022    OB History   Gravida  3  Para  2  Term   Preterm   AB   Living     SAB   IAB   Ectopic   Molar   Multiple   Live Births     Obstetric Comments  Age at first period 89Age of first pregnancy 89Last menstrual period age 89     Social History   Socioeconomic History  Marital status: Married  Tobacco Use  Smoking status: Never  Smokeless tobacco: Never  Substance and Sexual Activity  Alcohol use: Yes  Comment: social  Drug use: Never    Allergies  Allergen Reactions  Penicillins Rash   Current Outpatient Medications  Medication Sig  Dispense Refill  cholecalciferol, vitamin D3, (VITAMIN D3) 125 mcg (5,000 unit) tablet Take 1 tablet by mouth 3 (three) times a week  cyclobenzaprine (FLEXERIL) 5 MG tablet Take 5 mg by mouth 3 (three) times daily as needed  ibuprofen (MOTRIN) 600 MG tablet Take by mouth every 6 (six) hours as needed  PREMARIN 0.625 mg/gram vaginal cream INSERT APPLICATORFUL VAGINALLY AT BEDTIME FOR 2 WEEKS THEN TWICE WEEKLY THEREAFTER   No current facility-administered medications for this visit.   Family History  Problem Relation Age of Onset  Hyperlipidemia (Elevated cholesterol) Mother  Diabetes Mother  Heart disease Mother  Breast cancer Mother 975 Stroke Father  Clotting disorder Father  Parkinsonism Brother  Skin cancer Maternal Grandfather  Multiple sclerosis Paternal Grandmother  Breast cancer Maternal AAunt 73 metastatic breast cancer age 62 Brain cancer Maternal Uncle 782 Breast cancer Maternal Cousin 454  Labs and Radiology:   Imaging review:  Mammograms from July 06, 2021 through August 15, 2022 were independently reviewed.  Pathology:  DIAGNOSIS: COIL CLIP.  A. BREAST, RIGHT, UPPER INNER QUADRANT, AREA OF DISTORTION; CORE  BIOPSIES:  - RADIAL SCLEROSING LESION (RADIAL SCAR) WITH FOCAL ATYPICAL DUCTAL  HYPERPLASIA (SEE COMMENT).  - WITHIN THE BACKGROUND THERE IS FIBROSIS CYST FORMATION AND ADENOSIS  SCLEROSING ADENOSIS AND USUAL DUCTAL HYPERPLASIA AND APOCRINE  METAPLASIA.  -  FOCAL MICROCALCIFICATIONS PRESENT.  - NO EVIDENCE OF INVASIVE CARCINOMA OR DUCTAL CARCINOMA IN SITU.  Multiple cores, vacuum biopsy with mammographic guidance.  Measurement: Aggregate, 9 x 1.5 x 0.3 cm potentially, greater than 3 cm of tissue removed.  A. BREAST, RIGHT, 2 CM FROM NIPPLE, 6:00; CORE BIOPSY: RIBBON CLIP - NO EVIDENCE OF SIGNIFICANT ATYPIA OR MALIGNANCY.  - FOCAL BENIGN CYSTIC APOCRINE METAPLASIA Notably, the position of the ribbon shaped biopsy marking clip does not correspond with  the asymmetry in the central right breast posterior depth seen on screening and diagnostic mammograms. Therefore, an attempt at stereotactic guided biopsy was performed of this area but was ultimately unsuccessful due to limitations in patient positioning and the far posterior location of the asymmetry.  Laboratory reviewed June 15, 2022:  WBC 4.0 - 10.5 K/uL 4.7  RBC 3.87 - 5.11 Mil/uL 4.57  Hemoglobin 12.0 - 15.0 g/dL 14.1  HCT 36.0 - 46.0 % 42.9  MCV 78.0 - 100.0 fl 93.8  MCHC 30.0 - 36.0 g/dL 32.9  RDW 11.5 - 15.5 % 13.3  Platelets 150.0 - 400.0 K/uL 342.0  Neutrophils Relative % 43.0 - 77.0 % 57.5  Lymphocytes Relative 12.0 - 46.0 % 35.4  Monocytes Relative 3.0 - 12.0 % 5.3  Eosinophils Relative 0.0 - 5.0 % 1.3  Basophils Relative 0.0 - 3.0 % 0.5  Neutro Abs 1.4 - 7.7 K/uL 2.7  Lymphs Abs 0.7 - 4.0 K/uL 1.7  Monocytes Absolute 0.1 - 1.0 K/uL 0.3  Eosinophils Absolute 0.0 - 0.7 K/uL 0.1  Basophils Absolute 0.0 - 0.1 K/uL 0.0  Sodium 135 - 145 mEq/L 136  Potassium 3.5 - 5.1 mEq/L 4.4  Chloride 96 - 112 mEq/L 99  CO2 19 - 32 mEq/L 30  Glucose, Bld 70 - 99 mg/dL 84  BUN 6 - 23 mg/dL 14  Creatinine, Ser 0.40 - 1.20 mg/dL 0.75  Total Bilirubin 0.2 - 1.2 mg/dL 0.5  Alkaline Phosphatase 39 - 117 U/L 105  AST 0 - 37 U/L 17  ALT 0 - 35 U/L 16  Total Protein 6.0 - 8.3 g/dL 7.2  Albumin 3.5 - 5.2 g/dL 4.2  GFR >60.00 mL/min 85.60   Hgb A1c MFr Bld 4.6 - 6.5 % 5.7   Limited right breast ultrasound August 22, 2022: Postbiopsy changes  Postbiopsy changes are identified in the upper inner quadrant of the right breast. Neither is there a clearly defined biopsy cavity nor biopsy clip is evident. Wire localization will be required.   Objective:  Physical Exam Exam conducted with a chaperone present.  Constitutional:  Appearance: Normal appearance.  Cardiovascular:  Rate and Rhythm: Normal rate and regular rhythm.  Pulses: Normal pulses.  Heart sounds: Normal heart sounds.   Pulmonary:  Effort: Pulmonary effort is normal.  Breath sounds: Normal breath sounds.  Chest:  Breasts: Right: Skin change present.  Left: Normal.   Comments: 5 cm area of ecchymosis in the upper outer quadrant of the right breast about 7 cm from the nipple. Slight thickening, minimal hematoma.  Small area of ecchymosis at the 6 o'clock position of the right breast.. Musculoskeletal:  Cervical back: Neck supple.  Lymphadenopathy:  Upper Body:  Right upper body: No supraclavicular or axillary adenopathy.  Left upper body: No supraclavicular or axillary adenopathy.  Skin: General: Skin is warm and dry.  Neurological:  Mental Status: She is alert and oriented to person, place, and time.  Psychiatric:  Mood and Affect: Mood normal.  Behavior: Behavior normal.    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 Low likelihood of staging but warranting formal excision.  Plan:   As the area is not clearly defined on ultrasound, wire localization will be appropriate. The procedure was reviewed.  The ultrasound-guided biopsy was completed with the idea that the image on ultrasound corresponded to the deep mammographic abnormality. A 50-monthfollow-up mammogram will be required.    This note is partially prepared by MKarie Fetch RN, acting as a scribe in the presence of Dr. JHervey Ard MD.  The documentation recorded by the scribe accurately reflects the service I personally performed and the decisions made by me.   JRobert Bellow MD FACS  Electronically signed by BMayer Masker MD at 08/22/2022 1:17 PM EDT

## 2022-08-23 ENCOUNTER — Other Ambulatory Visit: Payer: Self-pay | Admitting: General Surgery

## 2022-08-23 DIAGNOSIS — N6091 Unspecified benign mammary dysplasia of right breast: Secondary | ICD-10-CM

## 2022-09-01 ENCOUNTER — Encounter
Admission: RE | Admit: 2022-09-01 | Discharge: 2022-09-01 | Disposition: A | Discharge status: Home / Self Care | Payer: Managed Care, Other (non HMO) | Source: Ambulatory Visit | Attending: General Surgery | Admitting: General Surgery

## 2022-09-01 HISTORY — DX: Unspecified benign mammary dysplasia of unspecified breast: N60.99

## 2022-09-01 HISTORY — DX: Anemia, unspecified: D64.9

## 2022-09-01 HISTORY — DX: Gastro-esophageal reflux disease without esophagitis: K21.9

## 2022-09-01 NOTE — Patient Instructions (Signed)
Your procedure is scheduled on:09-06-22 Wednesday Report to [facility] 1st @ 7:45 am. Proceed to the [facility]. Check in @ the Registration Desk.Then proceed to the 2nd floor Surgery Desk  REMEMBER: Instructions that are not followed completely may result in serious medical risk, up to and including death; or upon the discretion of your surgeon and anesthesiologist your surgery may need to be rescheduled.  Do not eat food after midnight the night before surgery.  No gum chewing, lozengers or hard candies.  You may however, drink CLEAR liquids up to 2 hours before you are scheduled to arrive for your surgery. Do not drink anything within 2 hours of your scheduled arrival time.  Clear liquids include: - water  - apple juice without pulp - gatorade (not RED colors) - black coffee or tea (Do NOT add milk or creamers to the coffee or tea) Do NOT drink anything that is not on this list.  Do NOT take any medication the day of surgery  One week prior to surgery: Stop Anti-inflammatories (NSAIDS) such as Advil, Aleve, Ibuprofen, Motrin, Naproxen, Naprosyn and Aspirin based products such as Excedrin, Goodys Powder, BC Powder.You may however, take Tylenol if needed for pain up until the day of surgery.  Stop ANY OVER THE COUNTER supplements/vitamins NOW (09-01-22) until after surgery (Vitamin D3)  No Alcohol for 24 hours before or after surgery.  No Smoking including e-cigarettes for 24 hours prior to surgery.  No chewable tobacco products for at least 6 hours prior to surgery.  No nicotine patches on the day of surgery.  Do not use any "recreational" drugs for at least a week prior to your surgery.  Please be advised that the combination of cocaine and anesthesia may have negative outcomes, up to and including death. If you test positive for cocaine, your surgery will be cancelled.  On the morning of surgery brush your teeth with toothpaste and water, you may rinse your mouth with mouthwash if  you wish. Do not swallow any toothpaste or mouthwash.  Use CHG Soap as directed on instruction sheet.  Do not wear jewelry, make-up, hairpins, clips or nail polish.  Do not wear lotions, powders, or perfumes.   Do not shave body from the neck down 48 hours prior to surgery just in case you cut yourself which could leave a site for infection.  Also, freshly shaved skin may become irritated if using the CHG soap.  Contact lenses, hearing aids and dentures may not be worn into surgery.  Do not bring valuables to the hospital. [facility] is not responsible for any missing/lost belongings or valuables.   Notify your doctor if there is any change in your medical condition (cold, fever, infection).  Wear comfortable clothing (specific to your surgery type) to the hospital.  After surgery, you can help prevent lung complications by doing breathing exercises.  Take deep breaths and cough every 1-2 hours. Your doctor may order a device called an Incentive Spirometer to help you take deep breaths. When coughing or sneezing, hold a pillow firmly against your incision with both hands. This is called "splinting." Doing this helps protect your incision. It also decreases belly discomfort.  If you are being admitted to the hospital overnight, leave your suitcase in the car. After surgery it may be brought to your room.  If you are being discharged the day of surgery, you will not be allowed to drive home. You will need a responsible adult (18 years or older) to drive you  home and stay with you that night.   If you are taking public transportation, you will need to have a responsible adult (18 years or older) with you. Please confirm with your physician that it is acceptable to use public transportation.   Please call the [facility] at [phone] if you have any questions about these instructions.  Surgery Visitation Policy:  Patients undergoing a surgery or procedure may  have two family members or support persons with them as long as the person is not COVID-19 positive or experiencing its symptoms.

## 2022-09-05 MED ORDER — ORAL CARE MOUTH RINSE: 15.0000 mL | Freq: Once | OROMUCOSAL | Status: AC

## 2022-09-05 MED ORDER — CHLORHEXIDINE GLUCONATE CLOTH 2 % EX PADS: 6.0000 | MEDICATED_PAD | Freq: Once | CUTANEOUS | Status: DC

## 2022-09-05 MED ORDER — LACTATED RINGERS IV SOLN: INTRAVENOUS | Status: DC

## 2022-09-05 MED ORDER — FAMOTIDINE 20 MG PO TABS
20.0000 mg | ORAL_TABLET | Freq: Once | ORAL | Status: AC
  Administered 2022-09-06: 20 mg via ORAL

## 2022-09-05 MED ORDER — CHLORHEXIDINE GLUCONATE 0.12 % MT SOLN
15.0000 mL | Freq: Once | OROMUCOSAL | Status: AC
  Administered 2022-09-06: 15 mL via OROMUCOSAL

## 2022-09-06 ENCOUNTER — Other Ambulatory Visit: Payer: Self-pay

## 2022-09-06 ENCOUNTER — Ambulatory Visit
Admission: RE | Admit: 2022-09-06 | Discharge: 2022-09-06 | Disposition: A | Discharge status: Home / Self Care | Payer: Managed Care, Other (non HMO) | Attending: General Surgery | Admitting: General Surgery

## 2022-09-06 ENCOUNTER — Ambulatory Visit: Payer: Managed Care, Other (non HMO) | Admitting: Anesthesiology

## 2022-09-06 ENCOUNTER — Ambulatory Visit
Admission: RE | Admit: 2022-09-06 | Discharge: 2022-09-06 | Disposition: A | Discharge status: Home / Self Care | Payer: Managed Care, Other (non HMO) | Source: Ambulatory Visit | Attending: General Surgery | Admitting: General Surgery

## 2022-09-06 ENCOUNTER — Encounter
Admission: RE | Disposition: A | Discharge status: Home / Self Care | Payer: Self-pay | Source: Home / Self Care | Attending: General Surgery

## 2022-09-06 ENCOUNTER — Encounter: Payer: Self-pay | Admitting: General Surgery

## 2022-09-06 DIAGNOSIS — N6091 Unspecified benign mammary dysplasia of right breast: Secondary | ICD-10-CM

## 2022-09-06 DIAGNOSIS — K219 Gastro-esophageal reflux disease without esophagitis: Secondary | ICD-10-CM | POA: Insufficient documentation

## 2022-09-06 DIAGNOSIS — L905 Scar conditions and fibrosis of skin: Secondary | ICD-10-CM | POA: Insufficient documentation

## 2022-09-06 HISTORY — PX: BREAST BIOPSY: SHX20

## 2022-09-06 SURGERY — BREAST BIOPSY WITH NEEDLE LOCALIZATION
Anesthesia: General | Laterality: Right

## 2022-09-06 MED ORDER — BUPIVACAINE-EPINEPHRINE (PF) 0.5% -1:200000 IJ SOLN
INTRAMUSCULAR | Status: AC
  Filled 2022-09-06: qty 30

## 2022-09-06 MED ORDER — ACETAMINOPHEN 10 MG/ML IV SOLN
INTRAVENOUS | Status: AC
  Filled 2022-09-06: qty 100

## 2022-09-06 MED ORDER — LIDOCAINE HCL (CARDIAC) PF 100 MG/5ML IV SOSY
PREFILLED_SYRINGE | INTRAVENOUS | Status: DC | PRN
  Administered 2022-09-06: 80 mg via INTRAVENOUS

## 2022-09-06 MED ORDER — FENTANYL CITRATE (PF) 100 MCG/2ML IJ SOLN
INTRAMUSCULAR | Status: DC | PRN
  Administered 2022-09-06 (×2): 25 ug via INTRAVENOUS
  Administered 2022-09-06: 50 ug via INTRAVENOUS

## 2022-09-06 MED ORDER — MIDAZOLAM HCL 2 MG/2ML IJ SOLN
INTRAMUSCULAR | Status: AC
  Filled 2022-09-06: qty 2

## 2022-09-06 MED ORDER — FENTANYL CITRATE (PF) 100 MCG/2ML IJ SOLN: 25.0000 ug | INTRAMUSCULAR | Status: DC | PRN

## 2022-09-06 MED ORDER — ACETAMINOPHEN 10 MG/ML IV SOLN
INTRAVENOUS | Status: DC | PRN
  Administered 2022-09-06: 1000 mg via INTRAVENOUS

## 2022-09-06 MED ORDER — FENTANYL CITRATE (PF) 100 MCG/2ML IJ SOLN
INTRAMUSCULAR | Status: AC
  Filled 2022-09-06: qty 2

## 2022-09-06 MED ORDER — PROPOFOL 10 MG/ML IV BOLUS
INTRAVENOUS | Status: DC | PRN
  Administered 2022-09-06: 100 mg via INTRAVENOUS

## 2022-09-06 MED ORDER — OXYCODONE HCL 5 MG PO TABS: 5.0000 mg | ORAL_TABLET | Freq: Once | ORAL | Status: DC | PRN

## 2022-09-06 MED ORDER — ONDANSETRON HCL 4 MG/2ML IJ SOLN
INTRAMUSCULAR | Status: AC
  Filled 2022-09-06: qty 2

## 2022-09-06 MED ORDER — PROPOFOL 10 MG/ML IV BOLUS
INTRAVENOUS | Status: AC
  Filled 2022-09-06: qty 20

## 2022-09-06 MED ORDER — LIDOCAINE HCL (PF) 2 % IJ SOLN
INTRAMUSCULAR | Status: AC
  Filled 2022-09-06: qty 5

## 2022-09-06 MED ORDER — DEXAMETHASONE SODIUM PHOSPHATE 10 MG/ML IJ SOLN
INTRAMUSCULAR | Status: DC | PRN
  Administered 2022-09-06: 10 mg via INTRAVENOUS

## 2022-09-06 MED ORDER — OXYCODONE HCL 5 MG/5ML PO SOLN: 5.0000 mg | Freq: Once | ORAL | Status: DC | PRN

## 2022-09-06 MED ORDER — ONDANSETRON HCL 4 MG/2ML IJ SOLN: 4.0000 mg | Freq: Once | INTRAMUSCULAR | Status: DC | PRN

## 2022-09-06 MED ORDER — ONDANSETRON HCL 4 MG/2ML IJ SOLN
INTRAMUSCULAR | Status: DC | PRN
  Administered 2022-09-06: 4 mg via INTRAVENOUS

## 2022-09-06 MED ORDER — ACETAMINOPHEN 10 MG/ML IV SOLN: 1000.0000 mg | Freq: Once | INTRAVENOUS | Status: DC | PRN

## 2022-09-06 MED ORDER — BUPIVACAINE-EPINEPHRINE (PF) 0.5% -1:200000 IJ SOLN
INTRAMUSCULAR | Status: DC | PRN
  Administered 2022-09-06: 30 mL

## 2022-09-06 MED ORDER — LACTATED RINGERS IV SOLN: INTRAVENOUS | Status: DC

## 2022-09-06 MED ORDER — DEXAMETHASONE SODIUM PHOSPHATE 10 MG/ML IJ SOLN
INTRAMUSCULAR | Status: AC
  Filled 2022-09-06: qty 1

## 2022-09-06 MED ORDER — MIDAZOLAM HCL 2 MG/2ML IJ SOLN
INTRAMUSCULAR | Status: DC | PRN
  Administered 2022-09-06: 2 mg via INTRAVENOUS

## 2022-09-06 MED ORDER — FAMOTIDINE 20 MG PO TABS
ORAL_TABLET | ORAL | Status: AC
  Filled 2022-09-06: qty 1

## 2022-09-06 MED ORDER — HYDROCODONE-ACETAMINOPHEN 5-325 MG PO TABS: 1.0000 | ORAL_TABLET | ORAL | 0 refills | Status: DC | PRN

## 2022-09-06 MED ORDER — CHLORHEXIDINE GLUCONATE 0.12 % MT SOLN
OROMUCOSAL | Status: AC
  Filled 2022-09-06: qty 15

## 2022-09-06 SURGICAL SUPPLY — 33 items
APL PRP STRL LF DISP 70% ISPRP (MISCELLANEOUS) ×2
BLADE SURG 15 STRL SS SAFETY (BLADE) ×2 IMPLANT
CHLORAPREP W/TINT 26 (MISCELLANEOUS) ×2 IMPLANT
DEVICE DUBIN SPECIMEN MAMMOGRA (MISCELLANEOUS) ×1 IMPLANT
DRAPE LAPAROTOMY 100X77 ABD (DRAPES) ×1 IMPLANT
DRSG GAUZE FLUFF 36X18 (GAUZE/BANDAGES/DRESSINGS) ×1 IMPLANT
DRSG TELFA 3X4 N-ADH STERILE (GAUZE/BANDAGES/DRESSINGS) ×2 IMPLANT
ELECT CAUTERY BLADE TIP 2.5 (TIP) ×1
ELECT REM PT RETURN 9FT ADLT (ELECTROSURGICAL) ×1
ELECTRODE CAUTERY BLDE TIP 2.5 (TIP) ×1 IMPLANT
ELECTRODE REM PT RTRN 9FT ADLT (ELECTROSURGICAL) ×1 IMPLANT
GAUZE 4X4 16PLY ~~LOC~~+RFID DBL (SPONGE) ×1 IMPLANT
GLOVE BIO SURGEON STRL SZ7.5 (GLOVE) ×1 IMPLANT
GLOVE SURG UNDER LTX SZ8 (GLOVE) ×1 IMPLANT
GOWN STRL REUS W/ TWL LRG LVL3 (GOWN DISPOSABLE) ×2 IMPLANT
GOWN STRL REUS W/TWL LRG LVL3 (GOWN DISPOSABLE) ×2
KIT TURNOVER KIT A (KITS) ×1 IMPLANT
LABEL OR SOLS (LABEL) ×1 IMPLANT
MANIFOLD NEPTUNE II (INSTRUMENTS) ×1 IMPLANT
MARGIN MAP 10MM (MISCELLANEOUS) ×1 IMPLANT
NDL HYPO 25X1 1.5 SAFETY (NEEDLE) ×1 IMPLANT
NEEDLE HYPO 22GX1.5 SAFETY (NEEDLE) ×1 IMPLANT
NEEDLE HYPO 25X1 1.5 SAFETY (NEEDLE) ×1 IMPLANT
PACK BASIN MINOR ARMC (MISCELLANEOUS) ×1 IMPLANT
SUT ETHILON 3-0 FS-10 30 BLK (SUTURE) ×1
SUT VIC AB 2-0 CT1 27 (SUTURE) ×1
SUT VIC AB 2-0 CT1 TAPERPNT 27 (SUTURE) ×1 IMPLANT
SUT VIC AB 4-0 FS2 27 (SUTURE) ×1 IMPLANT
SUTURE EHLN 3-0 FS-10 30 BLK (SUTURE) ×1 IMPLANT
SYR 10ML LL (SYRINGE) ×1 IMPLANT
TRAP FLUID SMOKE EVACUATOR (MISCELLANEOUS) ×1 IMPLANT
WATER STERILE IRR 1000ML POUR (IV SOLUTION) ×1 IMPLANT
WATER STERILE IRR 500ML POUR (IV SOLUTION) ×1 IMPLANT

## 2022-09-06 NOTE — Transfer of Care (Signed)
Immediate Anesthesia Transfer of Care Note  Patient: Maria [name]  Procedure(s) Performed: BREAST BIOPSY WITH NEEDLE LOCALIZATION (Right)  Patient Location: PACU  Anesthesia Type:General  Level of Consciousness: drowsy and responds to stimulation  Airway & Oxygen Therapy: Patient Spontanous Breathing and Patient connected to face mask oxygen  Post-op Assessment: Report given to RN and Post -op Vital signs reviewed and stable  Post vital signs: Reviewed and stable  Last Vitals:  Vitals Value Taken Time  BP 127/75 09/06/22 1310  Temp    Pulse 91 09/06/22 1311  Resp 18 09/06/22 1311  SpO2 100 % 09/06/22 1311  Vitals shown include unvalidated device data.  Last Pain:  Vitals:   09/06/22 1027  TempSrc: Temporal  PainSc: 0-No pain         Complications: No notable events documented.

## 2022-09-06 NOTE — Discharge Instructions (Signed)

## 2022-09-06 NOTE — Anesthesia Procedure Notes (Signed)
Procedure Name: LMA Insertion Date/Time: 09/06/2022 12:00 PM  Performed by: Rona Ravens, CRNAPre-anesthesia Checklist: Patient identified, Emergency Drugs available and Suction available Patient Re-evaluated:Patient Re-evaluated prior to induction Oxygen Delivery Method: Circle system utilized Preoxygenation: Pre-oxygenation with 100% oxygen Induction Type: IV induction Ventilation: Mask ventilation without difficulty LMA: LMA inserted LMA Size: 5.0 Tube type: Oral Number of attempts: 1 Placement Confirmation: positive ETCO2 and breath sounds checked- equal and bilateral Tube secured with: Tape Dental Injury: Teeth and Oropharynx as per pre-operative assessment

## 2022-09-06 NOTE — Op Note (Signed)
Preoperative diagnosis: Radial scar, ADH right breast.  Postoperative diagnosis: Same.  Operative procedure: Right breast biopsy with wire and ultrasound localization.  Operating surgeon  Hervey Ard, MD.  Anesthesia: General by LMA, Marcaine 0.5% with 1: 200,000 notes of epinephrine, 30 cc.  Estimated blood loss: 1 cc.  Clinical note: This 61 year old woman had a radiologic directed biopsy showing evidence of radial scar and ADH.  She was a candidate for formal excision.  Patient underwent wire localization the morning of the procedure.  Review of the films showed the clip at the thickened portion of the localizing wire.  The patient had SCD stockings for DVT prevention.  Antibiotics were not indicated.  Operative note: The patient underwent general anesthesia and tolerated this well.  The breast was cleansed with ChloraPrep and draped.  Ultrasound was used to help identify the pathway of the localizing wire.  This was marked.  Local anesthesia was infiltrated.  A radial incision at the 3 o'clock position was made.  The skin was incised sharply and the remaining dissection with electrocautery.  A 2 cm of cylinder of tissue approximately 5 cm in length was resected beginning at the thickened portion of the wire and extending medially underneath the areola.  Hemostasis was electrocautery.  The specimen was orientated and radiograph confirmed the intact wire tip and clip.  The deep tissue was approximated with interrupted 2-0 Vicryl figure-of-eight sutures.  The adipose layer was approximated in similar fashion.  The skin was closed with a running 4-0 Vicryl subcuticular suture.  Benzoin, Steri-Strips, Telfa and a compressive wrap were applied.  Patient tolerated procedure well was taken to PACU in stable condition.

## 2022-09-06 NOTE — Anesthesia Preprocedure Evaluation (Addendum)
Anesthesia Evaluation  Patient identified by MRN, date of birth, ID band Patient awake    Reviewed: Allergy & Precautions, NPO status , Patient's Chart, lab work & pertinent test results  History of Anesthesia Complications Negative for: history of anesthetic complications  Airway Mallampati: II   Neck ROM: Full    Dental   Crown :   Pulmonary neg pulmonary ROS,    Pulmonary exam normal breath sounds clear to auscultation       Cardiovascular Exercise Tolerance: Good negative cardio ROS Normal cardiovascular exam Rhythm:Regular Rate:Normal     Neuro/Psych negative neurological ROS     GI/Hepatic GERD  ,  Endo/Other  negative endocrine ROS  Renal/GU negative Renal ROS     Musculoskeletal   Abdominal   Peds  Hematology negative hematology ROS (+)   Anesthesia Other Findings   Reproductive/Obstetrics                            Anesthesia Physical Anesthesia Plan  ASA: 2  Anesthesia Plan: General   Post-op Pain Management:    Induction: Intravenous  PONV Risk Score and Plan: 3 and Ondansetron, Dexamethasone and Treatment may vary due to age or medical condition  Airway Management Planned: LMA  Additional Equipment:   Intra-op Plan:   Post-operative Plan: Extubation in OR  Informed Consent: I have reviewed the patients History and Physical, chart, labs and discussed the procedure including the risks, benefits and alternatives for the proposed anesthesia with the patient or authorized representative who has indicated his/her understanding and acceptance.     Dental advisory given  Plan Discussed with: CRNA  Anesthesia Plan Comments: (Patient consented for risks of anesthesia including but not limited to:  - adverse reactions to medications - damage to eyes, teeth, lips or other oral mucosa - nerve damage due to positioning  - sore throat or hoarseness - damage to heart,  brain, nerves, lungs, other parts of body or loss of life  Informed patient about role of CRNA in peri- and intra-operative care.  Patient voiced understanding.)        Anesthesia Quick Evaluation

## 2022-09-06 NOTE — H&P (Signed)
Maria [name] [id number] [birth year]     HPI: Patient with recent biopsy showing radial scar and ADH. For excision.   Medications Prior to Admission  Medication Sig Dispense Refill Last Dose   acetaminophen (TYLENOL) 325 MG tablet Take 325 mg by mouth every 6 (six) hours as needed.   Past Week   calcium carbonate (TUMS - DOSED IN MG ELEMENTAL CALCIUM) 500 MG chewable tablet Chew 1 tablet by mouth as needed for indigestion or heartburn.   Past Week   cholecalciferol (VITAMIN D3) 25 MCG (1000 UNIT) tablet Take 1,000 Units by mouth daily.   Past Week   conjugated estrogens (PREMARIN) vaginal cream INSERT 1 APPLICATORFULL VAGINALLY AT BEDTIME FOR 2 WEEKS, THEN TWICE WEEKLY THEREAFTER (Patient taking differently: 1 applicator 2 (two) times a week. INSERT 1 APPLICATORFULL VAGINALLY AT BEDTIME FOR 2 WEEKS, THEN TWICE WEEKLY THEREAFTER) 30 g 0 09/02/2022   ibuprofen (ADVIL) 600 MG tablet Take 1 tablet (600 mg total) by mouth every 6 (six) hours as needed. (Patient taking differently: Take 200 mg by mouth every 6 (six) hours as needed.) 30 tablet 0    Allergies  Allergen Reactions   Penicillins Hives   Past Medical History:  Diagnosis Date   Abnormal cervical Papanicolaou smear 1995   s/p LEEP    Anemia    during pregnancy   Atypical ductal hyperplasia of breast    Family history of brain cancer    Family history of breast cancer    Family history of skin cancer    GERD (gastroesophageal reflux disease)    rare   Greater trochanteric bursitis of left hip 09/15/2014   Ultrasound-guided injection September 15, 2049    Ovarian cyst 1993   s/p laparascopic surgery   Past Surgical History:  Procedure Laterality Date   BREAST BIOPSY Right 08/15/2022   [place] Core BX,Ribbon Clip,  Path Pending   BREAST BIOPSY Right 08/15/2022   Stereo Bx, Coil Clip - path pending   CERVICAL BIOPSY  W/ LOOP ELECTRODE EXCISION     OVARIAN CYST SURGERY     TONSILLECTOMY     as a child   Social History    Socioeconomic History   Marital status: Married    Spouse name: Not on file   Number of children: Not on file   Years of education: Not on file   Highest education level: Not on file  Occupational History   Not on file  Tobacco Use   Smoking status: Never   Smokeless tobacco: Never  Vaping Use   Vaping Use: Never used  Substance and Sexual Activity   Alcohol use: Not Currently    Comment: socially   Drug use: No   Sexual activity: Yes  Other Topics Concern   Not on file  Social History Narrative   Not on file   Social Determinants of Health   Financial Resource Strain: Not on file  Food Insecurity: Not on file  Transportation Needs: Not on file  Physical Activity: Not on file  Stress: Not on file  Social Connections: Not on file  Intimate Partner Violence: Not on file   Social History   Social History Narrative   Not on file     ROS: Negative.     PE: HEENT: Negative. Lungs: Clear. Cardio: RR.   Assessment/Plan:  Proceed with planned right breast biopsy. [facility] 09/06/2022

## 2022-09-06 NOTE — Anesthesia Postprocedure Evaluation (Signed)
Anesthesia Post Note  Patient: Maria [name]  Procedure(s) Performed: BREAST BIOPSY WITH NEEDLE LOCALIZATION (Right)  Patient location during evaluation: PACU Anesthesia Type: General Level of consciousness: awake and alert, oriented and patient cooperative Pain management: pain level controlled Vital Signs Assessment: post-procedure vital signs reviewed and stable Respiratory status: spontaneous breathing, nonlabored ventilation and respiratory function stable Cardiovascular status: blood pressure returned to baseline and stable Postop Assessment: adequate PO intake Anesthetic complications: no   No notable events documented.   Last Vitals:  Vitals:   09/06/22 1350 09/06/22 1400  BP: 102/82 (!) 146/97  Pulse:  77  Resp:  15  Temp: (!) 36.1 C (!) 36.3 C  SpO2:  97%    Last Pain:  Vitals:   09/06/22 1400  TempSrc: Temporal  PainSc: 2                  Darrin Nipper

## 2022-09-07 ENCOUNTER — Encounter: Payer: Self-pay | Admitting: General Surgery

## 2022-09-08 LAB — SURGICAL PATHOLOGY

## 2022-09-14 ENCOUNTER — Other Ambulatory Visit: Payer: Self-pay | Admitting: General Surgery

## 2022-09-14 DIAGNOSIS — Z79811 Long term (current) use of aromatase inhibitors: Secondary | ICD-10-CM

## 2022-09-14 NOTE — Progress Notes (Signed)
x

## 2022-10-03 ENCOUNTER — Ambulatory Visit
Admission: RE | Admit: 2022-10-03 | Discharge: 2022-10-03 | Disposition: A | Discharge status: Home / Self Care | Payer: Managed Care, Other (non HMO) | Source: Ambulatory Visit | Attending: General Surgery | Admitting: General Surgery

## 2022-10-03 DIAGNOSIS — Z780 Asymptomatic menopausal state: Secondary | ICD-10-CM | POA: Insufficient documentation

## 2022-10-03 DIAGNOSIS — Z13820 Encounter for screening for osteoporosis: Secondary | ICD-10-CM | POA: Diagnosis not present

## 2022-10-03 DIAGNOSIS — Z79811 Long term (current) use of aromatase inhibitors: Secondary | ICD-10-CM | POA: Insufficient documentation

## 2022-10-03 DIAGNOSIS — M8589 Other specified disorders of bone density and structure, multiple sites: Secondary | ICD-10-CM | POA: Diagnosis not present

## 2023-03-06 ENCOUNTER — Other Ambulatory Visit: Payer: Self-pay | Admitting: Internal Medicine

## 2023-04-25 ENCOUNTER — Other Ambulatory Visit: Payer: Self-pay | Admitting: Internal Medicine

## 2023-04-25 DIAGNOSIS — N6489 Other specified disorders of breast: Secondary | ICD-10-CM

## 2023-04-25 DIAGNOSIS — R928 Other abnormal and inconclusive findings on diagnostic imaging of breast: Secondary | ICD-10-CM

## 2023-06-18 ENCOUNTER — Ambulatory Visit (INDEPENDENT_AMBULATORY_CARE_PROVIDER_SITE_OTHER): Payer: Managed Care, Other (non HMO) | Admitting: Internal Medicine

## 2023-06-18 ENCOUNTER — Telehealth: Payer: Self-pay

## 2023-06-18 ENCOUNTER — Encounter: Payer: Self-pay | Admitting: Internal Medicine

## 2023-06-18 VITALS — BP 120/84 | HR 73 | Temp 98.4°F | Ht 67.5 in | Wt 177.4 lb

## 2023-06-18 DIAGNOSIS — D171 Benign lipomatous neoplasm of skin and subcutaneous tissue of trunk: Secondary | ICD-10-CM

## 2023-06-18 DIAGNOSIS — Z8742 Personal history of other diseases of the female genital tract: Secondary | ICD-10-CM

## 2023-06-18 DIAGNOSIS — Z636 Dependent relative needing care at home: Secondary | ICD-10-CM

## 2023-06-18 DIAGNOSIS — N6489 Other specified disorders of breast: Secondary | ICD-10-CM

## 2023-06-18 DIAGNOSIS — E785 Hyperlipidemia, unspecified: Secondary | ICD-10-CM

## 2023-06-18 DIAGNOSIS — Z0000 Encounter for general adult medical examination without abnormal findings: Secondary | ICD-10-CM

## 2023-06-18 DIAGNOSIS — R5383 Other fatigue: Secondary | ICD-10-CM

## 2023-06-18 LAB — CBC WITH DIFFERENTIAL/PLATELET
Basophils Absolute: 0 10*3/uL (ref 0.0–0.1)
Basophils Relative: 0.6 % (ref 0.0–3.0)
Eosinophils Absolute: 0.1 10*3/uL (ref 0.0–0.7)
Eosinophils Relative: 1.7 % (ref 0.0–5.0)
HCT: 39.9 % (ref 36.0–46.0)
Hemoglobin: 13.2 g/dL (ref 12.0–15.0)
Lymphocytes Relative: 33 % (ref 12.0–46.0)
Lymphs Abs: 1.5 10*3/uL (ref 0.7–4.0)
MCHC: 33 g/dL (ref 30.0–36.0)
MCV: 92.2 fl (ref 78.0–100.0)
Monocytes Absolute: 0.3 10*3/uL (ref 0.1–1.0)
Monocytes Relative: 5.9 % (ref 3.0–12.0)
Neutro Abs: 2.7 10*3/uL (ref 1.4–7.7)
Neutrophils Relative %: 58.8 % (ref 43.0–77.0)
Platelets: 263 10*3/uL (ref 150.0–400.0)
RBC: 4.33 Mil/uL (ref 3.87–5.11)
RDW: 13.1 % (ref 11.5–15.5)
WBC: 4.6 10*3/uL (ref 4.0–10.5)

## 2023-06-18 LAB — COMPREHENSIVE METABOLIC PANEL
ALT: 10 U/L (ref 0–35)
AST: 14 U/L (ref 0–37)
Albumin: 3.9 g/dL (ref 3.5–5.2)
Alkaline Phosphatase: 68 U/L (ref 39–117)
BUN: 13 mg/dL (ref 6–23)
CO2: 30 mEq/L (ref 19–32)
Calcium: 9.2 mg/dL (ref 8.4–10.5)
Chloride: 103 mEq/L (ref 96–112)
Creatinine, Ser: 0.8 mg/dL (ref 0.40–1.20)
GFR: 78.67 mL/min (ref >60.00)
Glucose, Bld: 104 mg/dL — ABNORMAL HIGH (ref 70–99)
Potassium: 4.2 mEq/L (ref 3.5–5.1)
Sodium: 139 mEq/L (ref 135–145)
Total Bilirubin: 0.5 mg/dL (ref 0.2–1.2)
Total Protein: 7.2 g/dL (ref 6.0–8.3)

## 2023-06-18 LAB — TSH: TSH: 1.12 u[IU]/mL (ref 0.35–5.50)

## 2023-06-18 LAB — LIPID PANEL
Cholesterol: 175 mg/dL (ref 0–200)
HDL: 61.8 mg/dL (ref >39.00)
LDL Cholesterol: 98 mg/dL (ref 0–99)
NonHDL: 113.41
Total CHOL/HDL Ratio: 3
Triglycerides: 79 mg/dL (ref 0.0–149.0)
VLDL: 15.8 mg/dL (ref 0.0–40.0)

## 2023-06-18 LAB — HEMOGLOBIN A1C: Hgb A1c MFr Bld: 5.7 % (ref 4.6–6.5)

## 2023-06-18 LAB — LDL CHOLESTEROL, DIRECT: Direct LDL: 103 mg/dL

## 2023-06-18 MED ORDER — PREMARIN 0.625 MG/GM VA CREA: TOPICAL_CREAM | VAGINAL | 2 refills | Status: DC

## 2023-06-18 MED ORDER — TAMOXIFEN CITRATE 20 MG PO TABS: 10.0000 mg | ORAL_TABLET | Freq: Every day | ORAL | 3 refills | Status: DC

## 2023-06-18 NOTE — Patient Instructions (Signed)
I have refilled the tamoxifen   If You change your mind about medication for your stress//anxiety,  let me know via mychart

## 2023-06-18 NOTE — Assessment & Plan Note (Signed)
Medial to right scapula  with occasional muscle symptoms.  Defers surgical referral

## 2023-06-18 NOTE — Assessment & Plan Note (Signed)
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  Now on preventive therapy with tamoxifen

## 2023-06-18 NOTE — Assessment & Plan Note (Addendum)
She is  to continue  screening with PAP smears every 3 years given remoteness of abnormal history. Last one was HPV negative in 2022 .  Transformation zone absent

## 2023-06-18 NOTE — Assessment & Plan Note (Signed)

## 2023-06-18 NOTE — Telephone Encounter (Signed)
Maria [name] called from [facility] regarding prescription for conjugated estrogens (PREMARIN) vaginal cream.  Maria [name] states she needs the dosage per application.

## 2023-06-18 NOTE — Progress Notes (Signed)
Patient ID: Maria [name], female    DOB: 10-31-1961  Age: 62 y.o. MRN: [id number]  The patient is here for annual preventive examination and management of other chronic and acute problems.   The risk factors are reflected in the social history.   The roster of all physicians providing medical care to patient - is listed in the Snapshot section of the chart.   Activities of daily living:  The patient is 100% independent in all ADLs: dressing, toileting, feeding as well as independent mobility   Home safety : The patient has smoke detectors in the home. They wear seatbelts.  There are no unsecured firearms at home. There is no violence in the home.    There is no risks for hepatitis, STDs or HIV. There is no   history of blood transfusion. They have no travel history to infectious disease endemic areas of the world.   The patient has seen their dentist in the last six month. They have seen their eye doctor in the last year. The patinet  denies slight hearing difficulty with regard to whispered voices and some television programs.  They have deferred audiologic testing in the last year.  They do not  have excessive sun exposure. Discussed the need for sun protection: hats, long sleeves and use of sunscreen if there is significant sun exposure.    Diet: the importance of a healthy diet is discussed. They do have a healthy diet.   The benefits of regular aerobic exercise were discussed. The patient  exercises  3 to 5 days per week  for  60 minutes.    Depression screen: there are no signs or vegative symptoms of depression- irritability, change in appetite, anhedonia, sadness/tearfullness.   The following portions of the patient's history were reviewed and updated as appropriate: allergies, current medications, past family history, past medical history,  past surgical history, past social history  and problem list.   Visual acuity was not assessed per patient preference since the patient has  regular follow up with an  ophthalmologist. Hearing and body mass index were assessed and reviewed.    During the course of the visit the patient was educated and counseled about appropriate screening and preventive services including : fall prevention , diabetes screening, nutrition counseling, colorectal cancer screening, and recommended immunizations.    Chief Complaint:   1) Radial scar,  atypical hyperplasia ; Tamoxifen use : flushing improved with reduction in dose  to 0 mg daily DEXA dosen -1.3 T socre 2023.    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  Mother  is now at at [facility],  recovering from hip fracture that occurred 15 minutes  prior to D/C from [facility] for hallucinations.  Calling patient constantly ,  high maintenance   3)    Review of Symptoms  Patient denies headache, fevers, malaise, unintentional weight loss, skin rash, eye pain, sinus congestion and sinus pain, sore throat, dysphagia,  hemoptysis , cough, dyspnea, wheezing, chest pain, palpitations, orthopnea, edema, abdominal pain, nausea, melena, diarrhea, constipation, flank pain, dysuria, hematuria, urinary  Frequency, nocturia, numbness, tingling, seizures,  Focal weakness, Loss of consciousness,  Tremor, insomnia, depression, anxiety, and suicidal ideation.    Physical Exam:  BP 120/84   Pulse 73   Temp 98.4 F (36.9 C) (Oral)   Ht 5' 7.5" (1.715 m)   Wt 177 lb 6.4 oz (80.5 kg)   LMP 06/22/2013   SpO2 97%   BMI 27.37 kg/m  Physical Exam Vitals reviewed.  Constitutional:      General: She is not in acute distress.    Appearance: Normal appearance. She is well-developed and normal weight. She is not ill-appearing, toxic-appearing or diaphoretic.  HENT:     Head: Normocephalic.     Right Ear: Tympanic membrane, ear canal and external ear normal. There is no impacted cerumen.     Left Ear: Tympanic membrane, ear canal and external ear normal. There is no impacted cerumen.     Nose:  Nose normal.     Mouth/Throat:     Mouth: Mucous membranes are moist.     Pharynx: Oropharynx is clear.  Eyes:     General: No scleral icterus.       Right eye: No discharge.        Left eye: No discharge.     Conjunctiva/sclera: Conjunctivae normal.     Pupils: Pupils are equal, round, and reactive to light.  Neck:     Thyroid: No thyromegaly.     Vascular: No carotid bruit or JVD.  Cardiovascular:     Rate and Rhythm: Normal rate and regular rhythm.     Heart sounds: Normal heart sounds.  Pulmonary:     Effort: Pulmonary effort is normal. No respiratory distress.     Breath sounds: Normal breath sounds.  Chest:  Breasts:    Breasts are symmetrical.     Right: Normal. No swelling, inverted nipple, mass, nipple discharge, skin change or tenderness.     Left: Normal. No swelling, inverted nipple, mass, nipple discharge, skin change or tenderness.  Abdominal:     General: Bowel sounds are normal.     Palpations: Abdomen is soft. There is no mass.     Tenderness: There is no abdominal tenderness. There is no guarding or rebound.  Musculoskeletal:        General: Normal range of motion.     Cervical back: Normal range of motion and neck supple.  Lymphadenopathy:     Cervical: No cervical adenopathy.     Upper Body:     Right upper body: No supraclavicular, axillary or pectoral adenopathy.     Left upper body: No supraclavicular, axillary or pectoral adenopathy.  Skin:    General: Skin is warm and dry.  Neurological:     General: No focal deficit present.     Mental Status: She is alert and oriented to person, place, and time. Mental status is at baseline.  Psychiatric:        Mood and Affect: Mood normal.        Behavior: Behavior normal.        Thought Content: Thought content normal.        Judgment: Judgment normal.     Assessment and Plan: Visit for preventive health examination Assessment & Plan: age appropriate education and counseling updated, referrals for  preventative services and immunizations addressed, dietary and smoking counseling addressed, most recent labs reviewed.  I have personally reviewed and have noted:   1) the patient's medical and social history 2) The pt's use of alcohol, tobacco, and illicit drugs 3) The patient's current medications and supplements 4) Functional ability including ADL's, fall risk, home safety risk, hearing and visual impairment 5) Diet and physical activities 6) Evidence for depression or mood disorder 7) The patient's height, weight, and BMI have been recorded in the chart    I have made referrals, and provided counseling and education based on review of the above  Orders: -     TSH  Dyslipidemia -     Lipid panel -     LDL cholesterol, direct -     Hemoglobin A1c -     CBC with Differential/Platelet -     Comprehensive metabolic panel  Other fatigue -     CBC with Differential/Platelet -     TSH  Radial scar of breast Assessment & Plan: 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  Now on preventive therapy with tamoxifen    History of abnormal cervical Papanicolaou smear Assessment & Plan: She is  to continue  screening with PAP smears every 3 years given remoteness of abnormal history. Last one was HPV negative in 2022 .  Transformation zone absent     Lipoma of back Assessment & Plan: Medial to right scapula  with occasional muscle symptoms.  Defers surgical referral    Caregiver burden Assessment & Plan: She has maintained excellent care of her elderly mother who lives with her but her mother currently is unable to return home due to recent hip fracture    Other orders -     Tamoxifen Citrate; Take 0.5 tablets (10 mg total) by mouth daily.  Dispense: 45 tablet; Refill: 3    Return in about 1 year (around 06/17/2024).  Sherlene Shams, MD

## 2023-06-18 NOTE — Assessment & Plan Note (Addendum)
She has maintained excellent care of her elderly mother who lives with her but her mother currently is unable to return home due to recent hip fracture

## 2023-07-23 ENCOUNTER — Ambulatory Visit
Admission: RE | Admit: 2023-07-23 | Discharge: 2023-07-23 | Disposition: A | Discharge status: Home / Self Care | Payer: Managed Care, Other (non HMO) | Source: Ambulatory Visit | Attending: Internal Medicine | Admitting: Internal Medicine

## 2023-07-23 DIAGNOSIS — R928 Other abnormal and inconclusive findings on diagnostic imaging of breast: Secondary | ICD-10-CM | POA: Insufficient documentation

## 2023-07-23 DIAGNOSIS — N6489 Other specified disorders of breast: Secondary | ICD-10-CM | POA: Insufficient documentation

## 2023-08-14 ENCOUNTER — Other Ambulatory Visit: Payer: Self-pay | Admitting: Internal Medicine

## 2023-11-14 ENCOUNTER — Other Ambulatory Visit: Payer: Self-pay | Admitting: Medical Genetics

## 2023-11-14 DIAGNOSIS — Z006 Encounter for examination for normal comparison and control in clinical research program: Secondary | ICD-10-CM

## 2023-12-18 ENCOUNTER — Other Ambulatory Visit: Payer: Self-pay | Admitting: Internal Medicine

## 2024-06-19 ENCOUNTER — Ambulatory Visit (INDEPENDENT_AMBULATORY_CARE_PROVIDER_SITE_OTHER): Payer: Managed Care, Other (non HMO) | Admitting: Internal Medicine

## 2024-06-19 ENCOUNTER — Encounter: Payer: Self-pay | Admitting: Internal Medicine

## 2024-06-19 ENCOUNTER — Other Ambulatory Visit (HOSPITAL_COMMUNITY)
Admission: RE | Admit: 2024-06-19 | Discharge: 2024-06-19 | Disposition: A | Discharge status: Home / Self Care | Source: Ambulatory Visit | Attending: Internal Medicine | Admitting: Internal Medicine

## 2024-06-19 ENCOUNTER — Other Ambulatory Visit: Payer: Self-pay | Admitting: Internal Medicine

## 2024-06-19 VITALS — BP 118/72 | HR 90 | Ht 67.5 in | Wt 174.8 lb

## 2024-06-19 DIAGNOSIS — N6489 Other specified disorders of breast: Secondary | ICD-10-CM | POA: Diagnosis not present

## 2024-06-19 DIAGNOSIS — R1031 Right lower quadrant pain: Secondary | ICD-10-CM

## 2024-06-19 DIAGNOSIS — D492 Neoplasm of unspecified behavior of bone, soft tissue, and skin: Secondary | ICD-10-CM

## 2024-06-19 DIAGNOSIS — Z124 Encounter for screening for malignant neoplasm of cervix: Secondary | ICD-10-CM | POA: Diagnosis present

## 2024-06-19 DIAGNOSIS — Z780 Asymptomatic menopausal state: Secondary | ICD-10-CM | POA: Insufficient documentation

## 2024-06-19 DIAGNOSIS — Z1231 Encounter for screening mammogram for malignant neoplasm of breast: Secondary | ICD-10-CM

## 2024-06-19 DIAGNOSIS — Z0000 Encounter for general adult medical examination without abnormal findings: Secondary | ICD-10-CM

## 2024-06-19 DIAGNOSIS — R5383 Other fatigue: Secondary | ICD-10-CM | POA: Diagnosis not present

## 2024-06-19 DIAGNOSIS — E785 Hyperlipidemia, unspecified: Secondary | ICD-10-CM | POA: Diagnosis not present

## 2024-06-19 DIAGNOSIS — Z0001 Encounter for general adult medical examination with abnormal findings: Secondary | ICD-10-CM

## 2024-06-19 DIAGNOSIS — Z1151 Encounter for screening for human papillomavirus (HPV): Secondary | ICD-10-CM | POA: Insufficient documentation

## 2024-06-19 LAB — COMPREHENSIVE METABOLIC PANEL WITH GFR
ALT: 10 U/L (ref 0–35)
AST: 13 U/L (ref 0–37)
Albumin: 4 g/dL (ref 3.5–5.2)
Alkaline Phosphatase: 70 U/L (ref 39–117)
BUN: 15 mg/dL (ref 6–23)
CO2: 29 meq/L (ref 19–32)
Calcium: 9.2 mg/dL (ref 8.4–10.5)
Chloride: 104 meq/L (ref 96–112)
Creatinine, Ser: 0.78 mg/dL (ref 0.40–1.20)
GFR: 80.52 mL/min (ref >60.00)
Glucose, Bld: 102 mg/dL — ABNORMAL HIGH (ref 70–99)
Potassium: 4.1 meq/L (ref 3.5–5.1)
Sodium: 140 meq/L (ref 135–145)
Total Bilirubin: 0.4 mg/dL (ref 0.2–1.2)
Total Protein: 6.8 g/dL (ref 6.0–8.3)

## 2024-06-19 LAB — CBC WITH DIFFERENTIAL/PLATELET
Basophils Absolute: 0 10*3/uL (ref 0.0–0.1)
Basophils Relative: 0.6 % (ref 0.0–3.0)
Eosinophils Absolute: 0.1 10*3/uL (ref 0.0–0.7)
Eosinophils Relative: 1.8 % (ref 0.0–5.0)
HCT: 38.5 % (ref 36.0–46.0)
Hemoglobin: 12.9 g/dL (ref 12.0–15.0)
Lymphocytes Relative: 41.4 % (ref 12.0–46.0)
Lymphs Abs: 1.7 10*3/uL (ref 0.7–4.0)
MCHC: 33.6 g/dL (ref 30.0–36.0)
MCV: 90.9 fl (ref 78.0–100.0)
Monocytes Absolute: 0.2 10*3/uL (ref 0.1–1.0)
Monocytes Relative: 5.1 % (ref 3.0–12.0)
Neutro Abs: 2.1 10*3/uL (ref 1.4–7.7)
Neutrophils Relative %: 51.1 % (ref 43.0–77.0)
Platelets: 283 10*3/uL (ref 150.0–400.0)
RBC: 4.23 Mil/uL (ref 3.87–5.11)
RDW: 13.5 % (ref 11.5–15.5)
WBC: 4.2 10*3/uL (ref 4.0–10.5)

## 2024-06-19 LAB — LIPID PANEL
Cholesterol: 183 mg/dL (ref 0–200)
HDL: 63.8 mg/dL (ref >39.00)
LDL Cholesterol: 102 mg/dL — ABNORMAL HIGH (ref 0–99)
NonHDL: 119.69
Total CHOL/HDL Ratio: 3
Triglycerides: 86 mg/dL (ref 0.0–149.0)
VLDL: 17.2 mg/dL (ref 0.0–40.0)

## 2024-06-19 LAB — HEMOGLOBIN A1C: Hgb A1c MFr Bld: 5.7 % (ref 4.6–6.5)

## 2024-06-19 LAB — LDL CHOLESTEROL, DIRECT: Direct LDL: 101 mg/dL

## 2024-06-19 LAB — TSH: TSH: 1 u[IU]/mL (ref 0.35–5.50)

## 2024-06-19 MED ORDER — TAMOXIFEN CITRATE 20 MG PO TABS: 10.0000 mg | ORAL_TABLET | Freq: Every day | ORAL | 3 refills | Status: AC

## 2024-06-19 MED ORDER — PREMARIN 0.625 MG/GM VA CREA: TOPICAL_CREAM | VAGINAL | 0 refills | Status: DC

## 2024-06-19 NOTE — Progress Notes (Signed)
 Patient ID: Maria [name], female    DOB: 04/21/1961  Age: 63 y.o. MRN: [id number]  The patient is here for annual preventive examination and management of other chronic and acute problems.   The risk factors are reflected in the social history.   The roster of all physicians providing medical care to patient - is listed in the Snapshot section of the chart.   Activities of daily living:  The patient is 100% independent in all ADLs: dressing, toileting, feeding as well as independent mobility   Home safety : The patient has smoke detectors in the home. They wear seatbelts.  There are no unsecured firearms at home. There is no violence in the home.    There is no risks for hepatitis, STDs or HIV. There is no   history of blood transfusion. They have no travel history to infectious disease endemic areas of the world.   The patient has seen their dentist in the last six month. They have seen their eye doctor in the last year. The patinet  denies slight hearing difficulty with regard to whispered voices and some television programs.  They have deferred audiologic testing in the last year.  They do not  have excessive sun exposure. Discussed the need for sun protection: hats, long sleeves and use of sunscreen if there is significant sun exposure.    Diet: the importance of a healthy diet is discussed. They do have a healthy diet.   The benefits of regular aerobic exercise were discussed. The patient  exercises  3 to 5 days per week  for  60 minutes.    Depression screen: there are no signs or vegative symptoms of depression- irritability, change in appetite, anhedonia, sadness/tearfullness.   The following portions of the patient's history were reviewed and updated as appropriate: allergies, current medications, past family history, past medical history,  past surgical history, past social history  and problem list.   Visual acuity was not assessed per patient preference since the patient has  regular follow up with an  ophthalmologist. Hearing and body mass index were assessed and reviewed.    During the course of the visit the patient was educated and counseled about appropriate screening and preventive services including : fall prevention , diabetes screening, nutrition counseling, colorectal cancer screening, and recommended immunizations.    Chief Complaint:  1) intermittent RLQ pain in the area of  right ovary . Described as  stabbing.   The pain is random, not aggravated by waking, ecercising r intercourse.  but  woke her up recenlty.  No history of nephrolithiasis  ,denies hematuria  2) cervical CA screening :  needed annually due to use of tamoxifen    3) new papular crusting lesion on upper arm , noticed last week     Review of Symptoms  Patient denies headache, fevers, malaise, unintentional weight loss, skin rash, eye pain, sinus congestion and sinus pain, sore throat, dysphagia,  hemoptysis , cough, dyspnea, wheezing, chest pain, palpitations, orthopnea, edema, , nausea, melena, diarrhea, constipation, flank pain, dysuria, hematuria, urinary  Frequency, nocturia, numbness, tingling, seizures,  Focal weakness, Loss of consciousness,  Tremor, insomnia, depression, anxiety, and suicidal ideation.    Physical Exam:  BP 118/72   Pulse 90   Ht 5' 7.5 (1.715 m)   Wt 174 lb 12.8 oz (79.3 kg)   LMP 06/22/2013   SpO2 97%   BMI 26.97 kg/m    Physical Exam Vitals reviewed.  Constitutional:  General: She is not in acute distress.    Appearance: Normal appearance. She is well-developed and normal weight. She is not ill-appearing, toxic-appearing or diaphoretic.  HENT:     Head: Normocephalic.     Right Ear: Tympanic membrane, ear canal and external ear normal. There is no impacted cerumen.     Left Ear: Tympanic membrane, ear canal and external ear normal. There is no impacted cerumen.     Nose: Nose normal.     Mouth/Throat:     Mouth: Mucous membranes are  moist.     Pharynx: Oropharynx is clear.   Eyes:     General: No scleral icterus.       Right eye: No discharge.        Left eye: No discharge.     Conjunctiva/sclera: Conjunctivae normal.     Pupils: Pupils are equal, round, and reactive to light.   Neck:     Thyroid : No thyromegaly.     Vascular: No carotid bruit or JVD.   Cardiovascular:     Rate and Rhythm: Normal rate and regular rhythm.     Heart sounds: Normal heart sounds.  Pulmonary:     Effort: Pulmonary effort is normal. No respiratory distress.     Breath sounds: Normal breath sounds.  Chest:  Breasts:    Breasts are symmetrical.     Right: Normal. No swelling, inverted nipple, mass, nipple discharge, skin change or tenderness.     Left: Normal. No swelling, inverted nipple, mass, nipple discharge, skin change or tenderness.      Comments: Surgical scar  Abdominal:     General: Bowel sounds are normal.     Palpations: Abdomen is soft. There is no mass.     Tenderness: There is no abdominal tenderness. There is no guarding or rebound.     Hernia: There is no hernia in the left inguinal area or right inguinal area.  Genitourinary:    Exam position: Lithotomy position.     Pubic Area: No rash or pubic lice.      Labia:        Right: No rash, tenderness, lesion or injury.        Left: No rash, tenderness, lesion or injury.      Vagina: Normal.     Cervix: Normal.     Uterus: Normal.      Adnexa: Right adnexa normal and left adnexa normal.   Musculoskeletal:        General: Normal range of motion.     Cervical back: Normal range of motion and neck supple.  Lymphadenopathy:     Cervical: No cervical adenopathy.     Upper Body:     Right upper body: No supraclavicular, axillary or pectoral adenopathy.     Left upper body: No supraclavicular, axillary or pectoral adenopathy.     Lower Body: No right inguinal adenopathy. No left inguinal adenopathy.   Skin:    General: Skin is warm and dry.     Findings: Rash  present. Rash is crusting and papular.         Comments: Erythematous papule right upper arm    Neurological:     General: No focal deficit present.     Mental Status: She is alert and oriented to person, place, and time. Mental status is at baseline.   Psychiatric:        Mood and Affect: Mood normal.        Behavior: Behavior normal.  Thought Content: Thought content normal.        Judgment: Judgment normal.       Assessment and Plan: Encounter for general adult medical examination with abnormal findings Assessment & Plan: age appropriate education and counseling updated, referrals for preventative services and immunizations addressed, dietary and smoking counseling addressed, most recent labs reviewed.  I have personally reviewed and have noted:   1) the patient's medical and social history 2) The pt's use of alcohol, tobacco, and illicit drugs 3) The patient's current medications and supplements 4) Functional ability including ADL's, fall risk, home safety risk, hearing and visual impairment 5) Diet and physical activities 6) Evidence for depression or mood disorder 7) The patient's height, weight, and BMI have been recorded in the chart    I have made referrals, and provided counseling and education based on review of the above    Dyslipidemia -     Lipid panel -     LDL cholesterol, direct -     Comprehensive metabolic panel with GFR -     Hemoglobin A1c  Other fatigue -     CBC with Differential/Platelet -     TSH  Encounter for screening mammogram for malignant neoplasm of breast -     3D Screening Mammogram, Left and Right; Future  Colicky RLQ abdominal pain -     US  PELVIC COMPLETE WITH TRANSVAGINAL; Future  Skin neoplasm Assessment & Plan: Referring to dermatology for biopsy   Orders: -     Ambulatory referral to Dermatology  Pap smear for cervical cancer screening -     Cytology - PAP  Colicky right lower quadrant pain Assessment &  Plan: Etiology unclear.  Pelvic exam normal today. .  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  Now on preventive therapy with tamoxifen  .  Will [facility] to [facility].    Other orders -     Tamoxifen  Citrate; Take 0.5 tablets (10 mg total) by mouth daily.  Dispense: 45 tablet; Refill: 3    No follow-ups on file.  Verneita LITTIE Kettering, MD

## 2024-06-19 NOTE — Patient Instructions (Signed)
  Referrals to Dr Arlee Molly and to [facility] for pelvic ultrasound    in process  . They will attempt to contact you 3 times and if you do not return their call they will quit trying SO PLEASE CHECK YOUR MESSAGES

## 2024-06-22 ENCOUNTER — Ambulatory Visit: Payer: Self-pay | Admitting: Internal Medicine

## 2024-06-22 DIAGNOSIS — N6489 Other specified disorders of breast: Secondary | ICD-10-CM

## 2024-06-22 DIAGNOSIS — Z803 Family history of malignant neoplasm of breast: Secondary | ICD-10-CM

## 2024-06-22 DIAGNOSIS — D492 Neoplasm of unspecified behavior of bone, soft tissue, and skin: Secondary | ICD-10-CM | POA: Insufficient documentation

## 2024-06-22 DIAGNOSIS — R1031 Right lower quadrant pain: Secondary | ICD-10-CM | POA: Insufficient documentation

## 2024-06-22 NOTE — Assessment & Plan Note (Signed)
 Referring to dermatology for biopsy

## 2024-06-22 NOTE — Assessment & Plan Note (Signed)

## 2024-06-22 NOTE — Assessment & Plan Note (Signed)
 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  Now on preventive therapy with tamoxifen  .  Will offer referral to [facility].

## 2024-06-22 NOTE — Assessment & Plan Note (Signed)
 Etiology unclear.  Pelvic exam normal today. .  Trasnvaginal ultrasound

## 2024-06-24 LAB — CYTOLOGY - PAP
Adequacy: ABSENT
Comment: NEGATIVE
Diagnosis: NEGATIVE
High risk HPV: NEGATIVE

## 2024-06-25 ENCOUNTER — Ambulatory Visit

## 2024-07-02 ENCOUNTER — Telehealth: Payer: Self-pay

## 2024-07-02 NOTE — Telephone Encounter (Signed)
 Received a fax from [facility] stating that they called to schedule an appointment but pt declined appt stating that the area has cleared up.

## 2024-07-30 ENCOUNTER — Other Ambulatory Visit: Payer: Self-pay | Admitting: General Surgery

## 2024-07-30 DIAGNOSIS — R928 Other abnormal and inconclusive findings on diagnostic imaging of breast: Secondary | ICD-10-CM

## 2024-09-19 ENCOUNTER — Other Ambulatory Visit: Payer: Self-pay | Admitting: General Surgery

## 2024-09-19 ENCOUNTER — Ambulatory Visit
Admission: RE | Admit: 2024-09-19 | Discharge: 2024-09-19 | Disposition: A | Discharge status: Home / Self Care | Source: Ambulatory Visit | Attending: General Surgery | Admitting: General Surgery

## 2024-09-19 DIAGNOSIS — R928 Other abnormal and inconclusive findings on diagnostic imaging of breast: Secondary | ICD-10-CM

## 2024-09-19 DIAGNOSIS — N6489 Other specified disorders of breast: Secondary | ICD-10-CM

## 2024-09-19 MED ORDER — IOPAMIDOL (ISOVUE-370) INJECTION 76%
100.0000 mL | Freq: Once | INTRAVENOUS | Status: AC | PRN
  Administered 2024-09-19: 100 mL via INTRAVENOUS

## 2024-09-23 ENCOUNTER — Other Ambulatory Visit: Payer: Self-pay | Admitting: General Surgery

## 2024-09-23 DIAGNOSIS — Z803 Family history of malignant neoplasm of breast: Secondary | ICD-10-CM

## 2024-09-23 DIAGNOSIS — Z8042 Family history of malignant neoplasm of prostate: Secondary | ICD-10-CM

## 2024-09-23 DIAGNOSIS — R928 Other abnormal and inconclusive findings on diagnostic imaging of breast: Secondary | ICD-10-CM

## 2024-10-09 ENCOUNTER — Other Ambulatory Visit

## 2024-10-16 ENCOUNTER — Ambulatory Visit: Payer: Self-pay | Admitting: General Surgery

## 2024-10-16 ENCOUNTER — Ambulatory Visit
Admission: RE | Admit: 2024-10-16 | Discharge: 2024-10-16 | Disposition: A | Discharge status: Home / Self Care | Source: Ambulatory Visit | Attending: General Surgery | Admitting: General Surgery

## 2024-10-16 DIAGNOSIS — Z803 Family history of malignant neoplasm of breast: Secondary | ICD-10-CM

## 2024-10-16 DIAGNOSIS — Z8042 Family history of malignant neoplasm of prostate: Secondary | ICD-10-CM

## 2024-10-16 DIAGNOSIS — R928 Other abnormal and inconclusive findings on diagnostic imaging of breast: Secondary | ICD-10-CM

## 2024-10-16 MED ORDER — GADOPICLENOL 0.5 MMOL/ML IV SOLN
8.0000 mL | Freq: Once | INTRAVENOUS | Status: AC | PRN
  Administered 2024-10-16: 8 mL via INTRAVENOUS

## 2024-10-17 ENCOUNTER — Other Ambulatory Visit: Payer: Self-pay | Admitting: General Surgery

## 2024-10-17 DIAGNOSIS — R9389 Abnormal findings on diagnostic imaging of other specified body structures: Secondary | ICD-10-CM

## 2024-10-22 ENCOUNTER — Other Ambulatory Visit: Payer: Self-pay | Admitting: Medical Genetics

## 2024-10-22 DIAGNOSIS — Z006 Encounter for examination for normal comparison and control in clinical research program: Secondary | ICD-10-CM

## 2024-10-23 ENCOUNTER — Ambulatory Visit
Admission: RE | Admit: 2024-10-23 | Discharge: 2024-10-23 | Disposition: A | Discharge status: Home / Self Care | Source: Ambulatory Visit | Attending: General Surgery | Admitting: General Surgery

## 2024-10-23 ENCOUNTER — Ambulatory Visit
Admission: RE | Admit: 2024-10-23 | Discharge: 2024-10-23 | Disposition: A | Source: Ambulatory Visit | Attending: General Surgery | Admitting: General Surgery

## 2024-10-23 DIAGNOSIS — R9389 Abnormal findings on diagnostic imaging of other specified body structures: Secondary | ICD-10-CM

## 2024-10-23 MED ORDER — GADOPICLENOL 0.5 MMOL/ML IV SOLN
8.0000 mL | Freq: Once | INTRAVENOUS | Status: AC | PRN
  Administered 2024-10-23: 8 mL via INTRAVENOUS

## 2024-10-24 LAB — SURGICAL PATHOLOGY

## 2025-01-12 ENCOUNTER — Other Ambulatory Visit: Payer: Self-pay | Admitting: Internal Medicine

## 2025-06-22 ENCOUNTER — Encounter: Admitting: Internal Medicine
